# Patient Record
Sex: Female | Born: 1950
Health system: Southern US, Community
[De-identification: ages and names within clinical notes are randomized; demographics above are authoritative.]

## PROBLEM LIST (undated history)

## (undated) DIAGNOSIS — M6530 Trigger finger, unspecified finger: Secondary | ICD-10-CM

## (undated) DIAGNOSIS — I10 Essential (primary) hypertension: Secondary | ICD-10-CM

## (undated) DIAGNOSIS — F32A Depression, unspecified: Secondary | ICD-10-CM

## (undated) DIAGNOSIS — M1990 Unspecified osteoarthritis, unspecified site: Secondary | ICD-10-CM

## (undated) DIAGNOSIS — I5189 Other ill-defined heart diseases: Secondary | ICD-10-CM

## (undated) DIAGNOSIS — E7800 Pure hypercholesterolemia, unspecified: Secondary | ICD-10-CM

## (undated) DIAGNOSIS — C50919 Malignant neoplasm of unspecified site of unspecified female breast: Secondary | ICD-10-CM

## (undated) DIAGNOSIS — K219 Gastro-esophageal reflux disease without esophagitis: Secondary | ICD-10-CM

## (undated) DIAGNOSIS — I671 Cerebral aneurysm, nonruptured: Secondary | ICD-10-CM

## (undated) DIAGNOSIS — Z9889 Other specified postprocedural states: Secondary | ICD-10-CM

## (undated) DIAGNOSIS — F329 Major depressive disorder, single episode, unspecified: Secondary | ICD-10-CM

## (undated) DIAGNOSIS — N1831 Chronic kidney disease, stage 3a: Secondary | ICD-10-CM

## (undated) DIAGNOSIS — Z923 Personal history of irradiation: Secondary | ICD-10-CM

## (undated) DIAGNOSIS — G3184 Mild cognitive impairment, so stated: Secondary | ICD-10-CM

## (undated) DIAGNOSIS — K635 Polyp of colon: Secondary | ICD-10-CM

## (undated) HISTORY — DX: Gastro-esophageal reflux disease without esophagitis: K21.9

## (undated) HISTORY — DX: Malignant neoplasm of unspecified site of unspecified female breast: C50.919

## (undated) HISTORY — DX: Polyp of colon: K63.5

## (undated) HISTORY — DX: Cerebral aneurysm, nonruptured: I67.1

## (undated) HISTORY — DX: Depression, unspecified: F32.A

## (undated) HISTORY — DX: Pure hypercholesterolemia, unspecified: E78.00

## (undated) HISTORY — DX: Major depressive disorder, single episode, unspecified: F32.9

## (undated) HISTORY — DX: Trigger finger, unspecified finger: M65.30

## (undated) HISTORY — DX: Essential (primary) hypertension: I10

---

## 1982-03-20 DIAGNOSIS — Z90710 Acquired absence of both cervix and uterus: Secondary | ICD-10-CM | POA: Insufficient documentation

## 1982-03-20 HISTORY — PX: ABDOMINAL HYSTERECTOMY: SHX81

## 1985-03-20 HISTORY — PX: NASAL SINUS SURGERY: SHX719

## 1997-03-20 HISTORY — PX: CHOLECYSTECTOMY: SHX55

## 2003-03-21 HISTORY — PX: OTHER SURGICAL HISTORY: SHX169

## 2008-03-20 HISTORY — PX: BREAST LUMPECTOMY: SHX2

## 2008-03-20 HISTORY — PX: BREAST SURGERY: SHX581

## 2010-03-29 ENCOUNTER — Emergency Department: Payer: Self-pay | Admitting: Emergency Medicine

## 2011-03-21 HISTORY — PX: CARPAL TUNNEL RELEASE: SHX101

## 2012-02-16 ENCOUNTER — Telehealth: Payer: Self-pay | Admitting: *Deleted

## 2012-02-16 NOTE — Telephone Encounter (Signed)
Confirmed 03/26/12 appt w/ pt.  Mailed before appt letter & packet to pt.  Took paperwork to [facility] to scan.

## 2012-02-23 ENCOUNTER — Other Ambulatory Visit: Payer: Self-pay | Admitting: *Deleted

## 2012-02-23 DIAGNOSIS — C50919 Malignant neoplasm of unspecified site of unspecified female breast: Secondary | ICD-10-CM | POA: Insufficient documentation

## 2012-02-23 DIAGNOSIS — C50911 Malignant neoplasm of unspecified site of right female breast: Secondary | ICD-10-CM | POA: Insufficient documentation

## 2012-03-20 DIAGNOSIS — Z9889 Other specified postprocedural states: Secondary | ICD-10-CM | POA: Insufficient documentation

## 2012-03-26 ENCOUNTER — Ambulatory Visit (HOSPITAL_BASED_OUTPATIENT_CLINIC_OR_DEPARTMENT_OTHER): Payer: 59 | Admitting: Oncology

## 2012-03-26 ENCOUNTER — Ambulatory Visit (HOSPITAL_BASED_OUTPATIENT_CLINIC_OR_DEPARTMENT_OTHER): Payer: 59

## 2012-03-26 ENCOUNTER — Other Ambulatory Visit (HOSPITAL_BASED_OUTPATIENT_CLINIC_OR_DEPARTMENT_OTHER): Payer: 59 | Admitting: Lab

## 2012-03-26 ENCOUNTER — Encounter: Payer: Self-pay | Admitting: Oncology

## 2012-03-26 VITALS — BP 151/91 | HR 70 | Temp 97.2°F | Resp 20 | Ht 61.0 in | Wt 135.0 lb

## 2012-03-26 DIAGNOSIS — L989 Disorder of the skin and subcutaneous tissue, unspecified: Secondary | ICD-10-CM

## 2012-03-26 DIAGNOSIS — C50919 Malignant neoplasm of unspecified site of unspecified female breast: Secondary | ICD-10-CM

## 2012-03-26 DIAGNOSIS — M899 Disorder of bone, unspecified: Secondary | ICD-10-CM

## 2012-03-26 DIAGNOSIS — M949 Disorder of cartilage, unspecified: Secondary | ICD-10-CM

## 2012-03-26 DIAGNOSIS — Z170 Estrogen receptor positive status [ER+]: Secondary | ICD-10-CM

## 2012-03-26 LAB — COMPREHENSIVE METABOLIC PANEL (CC13)
ALT: 20 U/L (ref 0–55)
BUN: 22 mg/dL (ref 7.0–26.0)
CO2: 26 mEq/L (ref 22–29)
Calcium: 10.3 mg/dL (ref 8.4–10.4)
Chloride: 103 mEq/L (ref 98–107)
Creatinine: 1 mg/dL (ref 0.6–1.1)
Glucose: 99 mg/dl (ref 70–99)
Total Bilirubin: 0.48 mg/dL (ref 0.20–1.20)

## 2012-03-26 LAB — CBC WITH DIFFERENTIAL/PLATELET
BASO%: 0.7 % (ref 0.0–2.0)
HCT: 41.9 % (ref 34.8–46.6)
LYMPH%: 20.9 % (ref 14.0–49.7)
MCHC: 33.1 g/dL (ref 31.5–36.0)
MCV: 98.2 fL (ref 79.5–101.0)
MONO#: 0.7 10*3/uL (ref 0.1–0.9)
MONO%: 7.7 % (ref 0.0–14.0)
NEUT%: 69.1 % (ref 38.4–76.8)
Platelets: 270 10*3/uL (ref 145–400)
RBC: 4.27 10*6/uL (ref 3.70–5.45)
WBC: 8.6 10*3/uL (ref 3.9–10.3)

## 2012-03-26 MED ORDER — VENLAFAXINE HCL ER 37.5 MG PO CP24: 37.5000 mg | ORAL_CAPSULE | Freq: Every day | ORAL | Status: DC

## 2012-03-26 MED ORDER — TAMOXIFEN CITRATE 20 MG PO TABS: 20.0000 mg | ORAL_TABLET | Freq: Every day | ORAL | Status: DC

## 2012-03-26 NOTE — Progress Notes (Signed)
ID: Carolyn Lowe   DOB: 19-Jun-1950  MR#: [id number]  NWG#:[id number]  PCP: Shary Decamp GYN: Olivia Mackie SU:  OTHER MD: C. Jaynie Bream [phone])   HISTORY OF PRESENT ILLNESS: The patient had routine mammography in [facility] 11/04/59 which showed a possible mass in the left breast. Additional views confirmed a spiculated mass with irregular borders which was taller than wide and ultrasound-guided biopsy was performed 11/17/2008. The pathology showed a 6 mm invasive ductal carcinoma, grade 1, estrogen and progesterone receptor positive, HER-2 negative. She had her definitive left lumpectomy 01/04/2009, showing no additional tumor in the breast and 0 of 2 sentinel lymph nodes involved. She received adjuvant radiation through December of 2010 at which time she started anastrozole. Her subsequent history is as detailed below.  INTERVAL HISTORY: Carolyn Lowe moved to this area to be closer to her daughter and grandchildren. 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  REVIEW OF SYSTEMS: She is tolerating the anastrozole without significant problems. She has minimal hot flashes at present. She does have significant vaginal dryness. She has some joint pain but is not sure if this is due to the anastrozole or to her statin. Otherwise she has a couple of skin spots she wants to do look at. A detailed review of systems was otherwise noncontributory  PAST MEDICAL HISTORY: No past medical history on file. Hypertension, history of a brain aneurysm which apparently resolved spontaneously, history of hyperplastic colonic polyps, GERD, right trigger finger, hypercholesterolemia, depression,  PAST SURGICAL HISTORY: No past surgical history on file. Status post TAH/BSO in her early 30s, history of right knee arthroscopic surgery, history of sinus surgery, history of right carpal tunnel release  FAMILY HISTORY No family history on file. The patient's parents are still living, both in  their early 61s. The patient had 2 brothers, no sister. There is no history of breast or ovarian cancer in the family  GYNECOLOGIC HISTORY: Menarche age 38, first live birth age 62, status post hysterectomy in 1984, on hormone replacement from that time to 2010.  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 Her child from her first marriage, [name], is a Engineer, civil (consulting) in our maternity hospital. The patient's second marriage resulted in no children. She has been married 13 years to [name], who works as a Radiation protection practitioner for the [employer]. The patient has one stepbrother daughter and one grandson, currently 102 years old. She is not a Management consultant.  ADVANCED DIRECTIVES: Not in place.  HEALTH MAINTENANCE: History  Substance Use Topics  . Smoking status: Not on file  . Smokeless tobacco: Not on file  . Alcohol Use: Not on file     Colonoscopy: February of 2013; repeat in 2018 recommended  PAP:  Bone density: Normal bone density August 2010, severe osteopenia with a T score of -2.2 in the spine with repeat bone density may 2013  Lipid panel:  Allergies not on file  Current Outpatient Prescriptions  Medication Sig Dispense Refill  . aspirin 81 MG tablet Take 81 mg by mouth daily.      . Glucosamine-Chondroitin-Ca-D3 (TRIPLE FLEX 50+ PO) Take 1 tablet by mouth daily.      . pantoprazole (PROTONIX) 40 MG tablet Take 40 mg by mouth daily.      . simvastatin (ZOCOR) 40 MG tablet Take 40 mg by mouth every evening.      . venlafaxine XR (EFFEXOR-XR) 37.5 MG 24 hr capsule Take 1 capsule (  37.5 mg total) by mouth daily.  90 capsule  12    OBJECTIVE: Middle-aged white woman who appears well Filed Vitals:   03/26/12 1618  BP: 151/91  Pulse: 70  Temp: 97.2 F (36.2 C)  Resp: 20     Body mass index is 25.51 kg/(m^2).    ECOG FS: 0  Sclerae unicteric Oropharynx clear No cervical or supraclavicular adenopathy Lungs no rales or  rhonchi Heart regular rate and rhythm Abd benign MSK no focal spinal tenderness, no peripheral edema Neuro: nonfocal Breasts: The right breast is unremarkable. The left breast is status post lumpectomy. There is no evidence of local recurrence. The cosmetic result is excellent. Left axilla is benign  Skin: She has some seborrheic keratoses of no consequence. In the middle of her back however there is a 2 mm raised firm acanthotic lesion which is not pigmented and has some crusting.    LAB RESULTS: Lab Results  Component Value Date   WBC 8.6 03/26/2012   NEUTROABS 5.9 03/26/2012   HGB 13.9 03/26/2012   HCT 41.9 03/26/2012   MCV 98.2 03/26/2012   PLT 270 03/26/2012      Chemistry      Component Value Date/Time   NA 148* 03/26/2012 1608   K 4.6 03/26/2012 1608   CL 103 03/26/2012 1608   CO2 26 03/26/2012 1608   BUN 22.0 03/26/2012 1608   CREATININE 1.0 03/26/2012 1608      Component Value Date/Time   CALCIUM 10.3 03/26/2012 1608   ALKPHOS 73 03/26/2012 1608   AST 18 03/26/2012 1608   ALT 20 03/26/2012 1608   BILITOT 0.48 03/26/2012 1608       No results found for this basename: LABCA2    No components found with this basename: LABCA125    No results found for this basename: INR:1;PROTIME:1 in the last 168 hours  Urinalysis No results found for this basename: colorurine, appearanceur, labspec, phurine, glucoseu, hgbur, bilirubinur, ketonesur, proteinur, urobilinogen, nitrite, leukocytesur    STUDIES: No results found. Outside records reviewed  ASSESSMENT: 62 y.o. [place] woman status post left lumpectomy and sentinel lymph node sampling October 2010 for a pT1b pN0, stage IA invasive ductal carcinoma, grade 1, estrogen and progesterone receptor positive, HER-2 not amplified  (1) completed adjuvant radiation December 2010  (2) on anastrozole December 2010 to January 2014  (3) started tamoxifen January 2014  (4) osteopenia, with a T score dropping from normal August 2010 to -2.2 (spine) May  2013  PLAN: We spent the better part of our hour-long visit reviewing the pathology and prognosis of Cathy's tumor. She understands her chance of dying from this tumor is less than 1%. With 5 years of anastrozole or the equivalent the risk of recurrence will be in the 7% range. These are very favorable numbers of coarse.  We discussed the fact that switching from anastrozole to tamoxifen at this point would have some benefits. First, tamoxifen helps build bone while and anastrozole lowers bone density. Secondly, she is having significant vaginal dryness issues, and while she is on tamoxifen she is welcome to use vaginal estrogens such as Vagifem or ESTRING. In terms of risk reduction of course splitting the 5 years of antiestrogen therapy between tamoxifen and an aromatase inhibitor is no worse than continuing for 5 years of an aromatase inhibitor.  She has that small lesion on her back which I think is likely benign but nevertheless needs dermatologic followup, and she will be contacting [facility] for evaluation.  She is also a couple of months behind on her mammography, and she tells me she will call the breast center and schedule that herself.  She will return to see me in May. If she is tolerating the tamoxifen well, we will see her again in October and then one final time October of 2015.   Demauri Advincula C    03/26/2012

## 2012-03-26 NOTE — Progress Notes (Signed)
Checked in new patient. She forgot new card will bring back. No financial issues.

## 2012-03-27 ENCOUNTER — Telehealth: Payer: Self-pay | Admitting: Oncology

## 2012-03-27 NOTE — Telephone Encounter (Signed)
S/W THE PT AND SHE IS AWARE OF HER MAY 2014 APPTS

## 2012-04-01 ENCOUNTER — Encounter: Payer: Self-pay | Admitting: *Deleted

## 2012-04-01 NOTE — Progress Notes (Signed)
Mailed after appt letter to pt. 

## 2012-08-05 ENCOUNTER — Telehealth: Payer: Self-pay | Admitting: Oncology

## 2012-08-05 ENCOUNTER — Encounter: Payer: Self-pay | Admitting: Physician Assistant

## 2012-08-05 ENCOUNTER — Other Ambulatory Visit (HOSPITAL_BASED_OUTPATIENT_CLINIC_OR_DEPARTMENT_OTHER): Payer: 59 | Admitting: Lab

## 2012-08-05 ENCOUNTER — Ambulatory Visit (HOSPITAL_BASED_OUTPATIENT_CLINIC_OR_DEPARTMENT_OTHER): Payer: 59 | Admitting: Physician Assistant

## 2012-08-05 VITALS — BP 143/75 | HR 80 | Temp 98.4°F | Resp 20 | Ht 61.0 in | Wt 132.2 lb

## 2012-08-05 DIAGNOSIS — C50919 Malignant neoplasm of unspecified site of unspecified female breast: Secondary | ICD-10-CM

## 2012-08-05 DIAGNOSIS — Z853 Personal history of malignant neoplasm of breast: Secondary | ICD-10-CM

## 2012-08-05 DIAGNOSIS — M899 Disorder of bone, unspecified: Secondary | ICD-10-CM

## 2012-08-05 DIAGNOSIS — Z170 Estrogen receptor positive status [ER+]: Secondary | ICD-10-CM

## 2012-08-05 DIAGNOSIS — C50912 Malignant neoplasm of unspecified site of left female breast: Secondary | ICD-10-CM

## 2012-08-05 LAB — CBC WITH DIFFERENTIAL/PLATELET
Basophils Absolute: 0 10*3/uL (ref 0.0–0.1)
EOS%: 0.5 % (ref 0.0–7.0)
HCT: 38.9 % (ref 34.8–46.6)
HGB: 12.7 g/dL (ref 11.6–15.9)
LYMPH%: 21 % (ref 14.0–49.7)
MCH: 31.8 pg (ref 25.1–34.0)
MCV: 97.3 fL (ref 79.5–101.0)
MONO%: 5.5 % (ref 0.0–14.0)
NEUT%: 72.7 % (ref 38.4–76.8)
RDW: 13.3 % (ref 11.2–14.5)

## 2012-08-05 NOTE — Progress Notes (Signed)
ID: Carolyn Lowe   DOB: 1950/10/30  MR#: [id number]  WUJ#:[id number]  PCP: Carolyn Lowe GYN: Carolyn Lowe SU:  OTHER MD: Carolyn Lowe [phone])   HISTORY OF PRESENT ILLNESS: The patient had routine mammography in [facility] 11/03/2008 which showed a possible mass in the left breast. Additional views confirmed a spiculated mass with irregular borders which was taller than wide and ultrasound-guided biopsy was performed 11/17/2008. The pathology showed a 6 mm invasive ductal carcinoma, grade 1, estrogen and progesterone receptor positive, HER-2 negative. She had her definitive left lumpectomy 01/04/2009, showing no additional tumor in the breast and 0 of 2 sentinel lymph nodes involved. She received adjuvant radiation through December of 2010 at which time she started anastrozole. Her subsequent history is as detailed below.  INTERVAL HISTORY: Carolyn Lowe returns today for routine followup of her left breast carcinoma. Following her appointment here in January, she discontinued anastrozole, and switched to tamoxifen. She's been on the tamoxifen now for approximately 4 months, and is tolerating it extremely well. In fact she tells me she is feeling much better than she did on the anastrozole. She still has some hot flashes but they're very mild. She previously had some joint pain, but this has completely resolved. She also denies any problems currently with vaginal dryness.  Otherwise, interval history is remarkable for Carolyn Lowe having just returned from a 2 week trip to [place] which she tells me was "wonderful". They flew to [place], then took a cruise that visited all of the different islands.   REVIEW OF SYSTEMS: Carolyn Lowe has had no recent illnesses and denies any fevers, chills, or night sweats. She's had no rashes and denies any abnormal bruising or bleeding. Her appetite is good. She's had no nausea or change in bowel or bladder habits. No new cough, shortness of breath, chest pain, or  palpitations. She denies any abnormal headaches. She's had no peripheral swelling.  A detailed review of systems is otherwise noncontributory.  She is tolerating the anastrozole without significant problems. She has minimal hot flashes at present. She does have significant vaginal dryness. She has some joint pain but is not sure if this is due to the anastrozole or to her statin. Otherwise she has a couple of skin spots she wants to do look at. A detailed review of systems was otherwise noncontributory  PAST MEDICAL HISTORY: Past Medical History  Diagnosis Date  . Breast cancer   . Hypertension   . GERD (gastroesophageal reflux disease)   . Brain aneurysm     Resolved spontaneously  . Colon polyp, hyperplastic   . Right trigger finger   . Hypercholesterolemia   . Depression     PAST SURGICAL HISTORY: Past Surgical History  Procedure Laterality Date  . Abdominal hysterectomy    . Right knee arthroscopy Right   . Nasal sinus surgery    . Carpal tunnel release Right      FAMILY HISTORY History reviewed. No pertinent family history. The patient's parents are still living, both in their early 79s. The patient had 2 brothers, no sister. There is no history of breast or ovarian cancer in the family  GYNECOLOGIC HISTORY: Menarche age 47, first live birth age 19, status post hysterectomy in 1984, on hormone replacement from that time to 2010.  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 Her child from her first marriage, Carolyn Lowe, is a Engineer, civil (consulting) in our maternity hospital. The patient's second marriage resulted in no children. She  has been married 13 years to Carolyn Lowe, who works as a Radiation protection practitioner for the [employer]. The patient has one granddaughter and one grandson. She is not a Management consultant.  ADVANCED DIRECTIVES: Not in place.  HEALTH MAINTENANCE: History  Substance Use Topics  . Smoking status: Never Smoker   .  Smokeless tobacco: Never Used  . Alcohol Use: No     Colonoscopy: February of 2013; repeat in 2018 recommended  PAP:  Bone density: Normal bone density August 2010, severe osteopenia with a T score of -2.2 in the spine with repeat bone density May 2013  Lipid panel:  No Known Allergies  Current Outpatient Prescriptions  Medication Sig Dispense Refill  . aspirin 81 MG tablet Take 81 mg by mouth daily.      . Glucosamine-Chondroitin-Ca-D3 (TRIPLE FLEX 50+ PO) Take 1 tablet by mouth daily.      Marland Kitchen lisinopril (PRINIVIL,ZESTRIL) 10 MG tablet       . pantoprazole (PROTONIX) 40 MG tablet Take 40 mg by mouth daily.      . simvastatin (ZOCOR) 40 MG tablet Take 40 mg by mouth every evening.      . tamoxifen (NOLVADEX) 20 MG tablet       . venlafaxine XR (EFFEXOR-XR) 37.5 MG 24 hr capsule Take 1 capsule (37.5 mg total) by mouth daily.  90 capsule  12   No current facility-administered medications for this visit.    OBJECTIVE: Middle-aged white woman who appears well Filed Vitals:   08/05/12 1334  BP: 143/75  Pulse: 80  Temp: 98.4 F (36.9 C)  Resp: 20     Body mass index is 24.99 kg/(m^2).    ECOG FS: 0 Filed Weights   08/05/12 1334  Weight: 132 lb 3.2 oz (59.966 kg)   Sclerae unicteric Oropharynx clear No cervical or supraclavicular adenopathy Lungs clear to auscultation bilaterally, no rales or rhonchi Heart regular rate and rhythm Abd soft, nontender to palpation, positive bowel sounds MSK no focal spinal tenderness to palpation, no peripheral edema Neuro: nonfocal, well oriented friendly affect Breasts: The right breast is unremarkable. The left breast is status post lumpectomy. There is no suspicious nodularity, and no evidence of local recurrence. Axillae are benign bilaterally with no palpable adenopathy.   LAB RESULTS: Lab Results  Component Value Date   WBC 8.6 08/05/2012   NEUTROABS 6.3 08/05/2012   HGB 12.7 08/05/2012   HCT 38.9 08/05/2012   MCV 97.3 08/05/2012   PLT  232 08/05/2012      Chemistry      Component Value Date/Time   NA 148* 03/26/2012 1608   K 4.6 03/26/2012 1608   CL 103 03/26/2012 1608   CO2 26 03/26/2012 1608   BUN 22.0 03/26/2012 1608   CREATININE 1.0 03/26/2012 1608      Component Value Date/Time   CALCIUM 10.3 03/26/2012 1608   ALKPHOS 73 03/26/2012 1608   AST 18 03/26/2012 1608   ALT 20 03/26/2012 1608   BILITOT 0.48 03/26/2012 1608       STUDIES: No results found.   ASSESSMENT: 62 y.o. [place] woman originally from [place]   (1)  status post left lumpectomy and sentinel lymph node sampling October 2010 for a pT1b pN0, stage IA invasive ductal carcinoma, grade 1, estrogen and progesterone receptor positive, HER-2 not amplified  (2) completed adjuvant radiation December 2010  (3) on anastrozole December 2010 to January 2014  (4) started tamoxifen January 2014  (5) osteopenia, with a  T score dropping from normal August 2010 to -2.2 (spine) May 2013  PLAN:  Carolyn Lowe is tolerating the tamoxifen very well, and with regards to her breast cancer there is no clinical evidence of disease recurrence. She'll continue on tamoxifen daily. She is due for her mammogram which we are having scheduled for her later this week. She return to see [place] in 6 months, but will call prior that time with any changes or problems.    Tashonna Descoteaux    08/05/2012

## 2012-08-27 ENCOUNTER — Ambulatory Visit
Admission: RE | Admit: 2012-08-27 | Discharge: 2012-08-27 | Disposition: A | Discharge status: Home / Self Care | Payer: 59 | Source: Ambulatory Visit | Attending: Physician Assistant | Admitting: Physician Assistant

## 2012-08-27 DIAGNOSIS — Z853 Personal history of malignant neoplasm of breast: Secondary | ICD-10-CM

## 2013-01-27 ENCOUNTER — Other Ambulatory Visit (HOSPITAL_BASED_OUTPATIENT_CLINIC_OR_DEPARTMENT_OTHER): Payer: 59

## 2013-01-27 DIAGNOSIS — C50919 Malignant neoplasm of unspecified site of unspecified female breast: Secondary | ICD-10-CM

## 2013-01-27 DIAGNOSIS — C50912 Malignant neoplasm of unspecified site of left female breast: Secondary | ICD-10-CM

## 2013-01-27 LAB — COMPREHENSIVE METABOLIC PANEL (CC13)
Albumin: 3.8 g/dL (ref 3.5–5.0)
Alkaline Phosphatase: 46 U/L (ref 40–150)
Anion Gap: 9 mEq/L (ref 3–11)
BUN: 17.7 mg/dL (ref 7.0–26.0)
Glucose: 84 mg/dl (ref 70–140)
Potassium: 4.4 mEq/L (ref 3.5–5.1)

## 2013-01-27 LAB — CBC WITH DIFFERENTIAL/PLATELET
Basophils Absolute: 0.1 10*3/uL (ref 0.0–0.1)
Eosinophils Absolute: 0.2 10*3/uL (ref 0.0–0.5)
HGB: 13 g/dL (ref 11.6–15.9)
LYMPH%: 26.5 % (ref 14.0–49.7)
MCV: 97.7 fL (ref 79.5–101.0)
MONO%: 8.6 % (ref 0.0–14.0)
NEUT#: 4 10*3/uL (ref 1.5–6.5)
Platelets: 254 10*3/uL (ref 145–400)
RDW: 12.7 % (ref 11.2–14.5)

## 2013-02-03 ENCOUNTER — Encounter: Payer: Self-pay | Admitting: Physician Assistant

## 2013-02-03 ENCOUNTER — Ambulatory Visit (HOSPITAL_BASED_OUTPATIENT_CLINIC_OR_DEPARTMENT_OTHER): Payer: 59 | Admitting: Physician Assistant

## 2013-02-03 ENCOUNTER — Telehealth: Payer: Self-pay | Admitting: Oncology

## 2013-02-03 VITALS — BP 127/74 | HR 86 | Temp 98.2°F | Resp 18 | Ht 61.0 in | Wt 131.9 lb

## 2013-02-03 DIAGNOSIS — Z853 Personal history of malignant neoplasm of breast: Secondary | ICD-10-CM

## 2013-02-03 DIAGNOSIS — M8580 Other specified disorders of bone density and structure, unspecified site: Secondary | ICD-10-CM

## 2013-02-03 DIAGNOSIS — G4700 Insomnia, unspecified: Secondary | ICD-10-CM | POA: Insufficient documentation

## 2013-02-03 DIAGNOSIS — C50919 Malignant neoplasm of unspecified site of unspecified female breast: Secondary | ICD-10-CM

## 2013-02-03 DIAGNOSIS — Z780 Asymptomatic menopausal state: Secondary | ICD-10-CM

## 2013-02-03 DIAGNOSIS — M899 Disorder of bone, unspecified: Secondary | ICD-10-CM

## 2013-02-03 MED ORDER — TRAZODONE HCL 50 MG PO TABS: 50.0000 mg | ORAL_TABLET | Freq: Every day | ORAL | Status: DC

## 2013-02-03 NOTE — Telephone Encounter (Signed)
, °

## 2013-02-03 NOTE — Progress Notes (Signed)
ID: Carolyn Lowe   DOB: [birth year]  MR#: [id number]  WUJ#:[id number]  PCP: Thayer Headings, MD GYN: Olivia Mackie, MD SU:  OTHER MD: C. Jaynie Bream [phone])  CHIEF COMPLAINT:  Left Breast Cancer   HISTORY OF PRESENT ILLNESS: The patient had routine mammography in [facility] 11/03/2008 which showed a possible mass in the left breast. Additional views confirmed a spiculated mass with irregular borders which was taller than wide and ultrasound-guided biopsy was performed 11/17/2008. The pathology showed a 6 mm invasive ductal carcinoma, grade 1, estrogen and progesterone receptor positive, HER-2 negative. She had her definitive left lumpectomy 01/04/2009, showing no additional tumor in the breast and 0 of 2 sentinel lymph nodes involved. She received adjuvant radiation through December of 2010 at which time she started anastrozole. Her subsequent history is as detailed below.  INTERVAL HISTORY: Carolyn Lowe returns today for routine followup of her left breast carcinoma. She continues on tamoxifen with good tolerance. She finds that she sweats a little easier, but denies any actual hot flashes. She's had no signs of abnormal bleeding or blood clots. Specifically, she's had no vaginal bleeding and also denies any vaginal discharge or dryness.   In fact Carolyn Lowe's biggest complaint today is continued insomnia.  This is a chronic problem, and she has tried multiple treatments in the past. She took him in several years ago which was helpful. Benadryl does not make her sleepy. Melatonin does not make her sleepy. She has tried a therapy. She has good sleep habits, and tries to go to bed at the same time every night. She finds that she "tosses and turns" and wakes up several times throughout the night. This does not seem to be associated, however, with hot flashes.   Otherwise, interval history is remarkable for the fact that Carolyn Lowe's brother who is autistic and is currently residing with their  elderly mother in [place] is going to be relocating to [place], and will be living with [place]. Carolyn Lowe the process of "adding on" to the house.    REVIEW OF SYSTEMS: Carolyn Lowe has had no recent illnesses and denies any fevers, chills, or night sweats. She's had no rashes and denies any abnormal bruising or bleeding. Her appetite is good and she denies any nausea or change in bowel or bladder habits.She has had no new cough, shortness of breath, chest pain, or palpitations. She denies any abnormal headaches, dizziness, or change in vision. She's had no new or unusual myalgias, arthralgias, or bony pain. She's had no peripheral swelling.  A detailed review of systems is otherwise noncontributory.   PAST MEDICAL HISTORY: Past Medical History  Diagnosis Date  . Breast cancer   . Hypertension   . GERD (gastroesophageal reflux disease)   . Brain aneurysm     Resolved spontaneously  . Colon polyp, hyperplastic   . Right trigger finger   . Hypercholesterolemia   . Depression     PAST SURGICAL HISTORY: Past Surgical History  Procedure Laterality Date  . Abdominal hysterectomy    . Right knee arthroscopy Right   . Nasal sinus surgery    . Carpal tunnel release Right      FAMILY HISTORY No family history on file. The patient's parents are still living, both in their early 29s. The patient had 2 brothers, no sister. There is no history of breast or ovarian cancer in the family  GYNECOLOGIC HISTORY: Menarche age 74, first live birth age 66, status post hysterectomy in 1984, on hormone replacement from that time  to 2010.  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 Her child from her first marriage, Carolyn Lowe, is a Engineer, civil (consulting) in our maternity hospital. The patient's second marriage resulted in no children. She has been married 13 years to Carolyn Lowe, who works as a Radiation protection practitioner for the [employer].The patient has one  granddaughter and one grandson. She is not a Counsellor.  ADVANCED DIRECTIVES: Not in place.  HEALTH MAINTENANCE: (Updated November 2014) History  Substance Use Topics  . Smoking status: Never Smoker   . Smokeless tobacco: Never Used  . Alcohol Use: No     Colonoscopy: February of 2013; repeat in 2018    recommended  PAP:  s/p hysterectomy/Pelvic exam UTD, Dr. [name]  Bone density: Normal bone density August 2010, severe    osteopenia with a T score of -2.2 in the    spine with repeat bone density May 2013  Lipid panel:  Dr. [name]   No Known Allergies  Current Outpatient Prescriptions  Medication Sig Dispense Refill  . aspirin 81 MG tablet Take 81 mg by mouth daily.      Marland Kitchen FIBER SELECT GUMMIES PO Take by mouth daily.      . Glucosamine-Chondroitin-Ca-D3 (TRIPLE FLEX 50+ PO) Take 1 tablet by mouth daily.      Marland Kitchen lisinopril (PRINIVIL,ZESTRIL) 10 MG tablet       . Multiple Vitamins-Minerals (WOMENS MULTI VITAMIN & MINERAL PO) Take by mouth daily after supper.      . pantoprazole (PROTONIX) 40 MG tablet Take 40 mg by mouth daily.      . simvastatin (ZOCOR) 40 MG tablet Take 40 mg by mouth every evening.      . tamoxifen (NOLVADEX) 20 MG tablet       . venlafaxine XR (EFFEXOR-XR) 37.5 MG 24 hr capsule Take 1 capsule (37.5 mg total) by mouth daily.  90 capsule  12  . traZODone (DESYREL) 50 MG tablet Take 1 tablet (50 mg total) by mouth at bedtime.  30 tablet  1   No current facility-administered medications for this visit.    OBJECTIVE: Middle-aged white woman who appears well and is in no acute distress Filed Vitals:   02/03/13 1428  BP: 127/74  Pulse: 86  Temp: 98.2 F (36.8 C)  Resp: 18     Body mass index is 24.93 kg/(m^2).    ECOG FS: 0 Filed Weights   02/03/13 1428  Weight: 131 lb 14.4 oz (59.829 kg)   Physical Exam: HEENT:  Sclerae anicteric.  Oropharynx clear. Buccal mucosa is pink and moist. NODES:  No cervical or supraclavicular lymphadenopathy palpated.   BREAST EXAM:  Right breast is unremarkable. Left breast is status post lumpectomy with no evidence of local recurrence. Axillae are benign bilaterally, no palpable lymphadenopathy. LUNGS:  Clear to auscultation bilaterally.  No wheezes or rhonchi HEART:  Regular rate and rhythm. No murmur appreciated. ABDOMEN:  Soft, nontender.  Positive bowel sounds.  MSK:  No focal spinal tenderness to palpation. Full range of motion in the upper extremities. No joint swelling. EXTREMITIES:  No peripheral edema.   NEURO:  Nonfocal. Well oriented.  Positive affect.    LAB RESULTS: Lab Results  Component Value Date   WBC 6.7 01/27/2013   NEUTROABS 4.0 01/27/2013   HGB 13.0 01/27/2013   HCT 39.1 01/27/2013   MCV 97.7 01/27/2013   PLT 254 01/27/2013      Chemistry  Component Value Date/Time   NA 141 01/27/2013 1033   K 4.4 01/27/2013 1033   CL 103 03/26/2012 1608   CO2 24 01/27/2013 1033   BUN 17.7 01/27/2013 1033   CREATININE 0.9 01/27/2013 1033      Component Value Date/Time   CALCIUM 9.5 01/27/2013 1033   ALKPHOS 46 01/27/2013 1033   AST 22 01/27/2013 1033   ALT 24 01/27/2013 1033   BILITOT 0.47 01/27/2013 1033       STUDIES:  A bilateral mammogram on 08/27/2012 was unremarkable.  Most recent bone density in May 2013 showed osteopenia with a T score of -2.2.   ASSESSMENT: 62 y.o. [place] woman originally from [place]   (1)  status post left lumpectomy and sentinel lymph node sampling October 2010 for a pT1b pN0, stage IA invasive ductal carcinoma, grade 1, estrogen and progesterone receptor positive, HER-2 not amplified  (2) completed adjuvant radiation December 2010  (3) on anastrozole December 2010 to January 2014  (4) started tamoxifen January 2014  (5) osteopenia, with a T score dropping from normal August 2010 to -2.2 (spine) May 2013  PLAN:  Carolyn Lowe continues to tolerate the tamoxifen well, and will continue on her current regimen. With regards to her  breast cancer, she seems to be doing well, with no clinical evidence of disease recurrence. She'll be due for her next bilateral mammogram as well as a repeat bone density next June, and those will be scheduled accordingly at the breast Center.   We spent some time reviewing her sleep habits, and discussing options for treatment of insomnia. We're going to try a low dose of trazodone, 50 mg at night. I have given her the prescription today, 30 days with one refill. If this is helpful for her over the next few weeks, I have asked her to contact her primary care physician, Dr. [name], for followup, and in the future he will follow her for the insomnia.   Otherwise, we will see Carolyn Lowe again in late June after her mammogram and bone density. If she is still doing well at that point, we will likely initiate annual visits. She voices understanding and agreement with this plan and will call with any changes or problems prior to her next appointment.     Eddy Liszewski  PA-C    02/03/2013

## 2013-05-10 ENCOUNTER — Other Ambulatory Visit: Payer: Self-pay | Admitting: Oncology

## 2013-05-10 DIAGNOSIS — C50919 Malignant neoplasm of unspecified site of unspecified female breast: Secondary | ICD-10-CM

## 2013-05-12 ENCOUNTER — Telehealth: Payer: Self-pay | Admitting: *Deleted

## 2013-05-12 NOTE — Telephone Encounter (Signed)
Request form [facility] for Trazodone forwarded to Dr Thressa Sheller, PCP along with last office note, as discussed when prescribed in 01/2013 by Micah Flesher, PA

## 2013-05-27 ENCOUNTER — Emergency Department: Payer: Self-pay | Admitting: Emergency Medicine

## 2013-05-27 LAB — CBC WITH DIFFERENTIAL/PLATELET
BASOS PCT: 1.3 %
Basophil #: 0.1 10*3/uL (ref 0.0–0.1)
Eosinophil #: 0.2 10*3/uL (ref 0.0–0.7)
Eosinophil %: 2.6 %
HCT: 39 % (ref 35.0–47.0)
HGB: 13.1 g/dL (ref 12.0–16.0)
LYMPHS ABS: 1.8 10*3/uL (ref 1.0–3.6)
LYMPHS PCT: 25.1 %
MCH: 32.6 pg (ref 26.0–34.0)
MCHC: 33.5 g/dL (ref 32.0–36.0)
MCV: 97 fL (ref 80–100)
MONO ABS: 1 x10 3/mm — AB (ref 0.2–0.9)
Monocyte %: 13.3 %
Neutrophil #: 4.2 10*3/uL (ref 1.4–6.5)
Neutrophil %: 57.7 %
PLATELETS: 225 10*3/uL (ref 150–440)
RBC: 4.01 10*6/uL (ref 3.80–5.20)
RDW: 13.1 % (ref 11.5–14.5)
WBC: 7.3 10*3/uL (ref 3.6–11.0)

## 2013-05-27 LAB — BASIC METABOLIC PANEL
Anion Gap: 5 — ABNORMAL LOW (ref 7–16)
BUN: 16 mg/dL (ref 7–18)
CALCIUM: 8.9 mg/dL (ref 8.5–10.1)
Chloride: 109 mmol/L — ABNORMAL HIGH (ref 98–107)
Co2: 27 mmol/L (ref 21–32)
Creatinine: 1.03 mg/dL (ref 0.60–1.30)
EGFR (African American): >60
EGFR (Non-African Amer.): 59 — ABNORMAL LOW
Glucose: 108 mg/dL — ABNORMAL HIGH (ref 65–99)
OSMOLALITY: 283 (ref 275–301)
Potassium: 3.9 mmol/L (ref 3.5–5.1)
Sodium: 141 mmol/L (ref 136–145)

## 2013-05-27 LAB — ETHANOL: Ethanol: <3 mg/dL

## 2013-05-27 LAB — TROPONIN I: Troponin-I: <0.02 ng/mL

## 2013-06-17 ENCOUNTER — Telehealth: Payer: Self-pay | Admitting: Oncology

## 2013-06-17 NOTE — Telephone Encounter (Signed)
june appts cxd per 3/29 pof. lmonvm informing pt and gv new appts for sept/oct. schedule mailed.

## 2013-07-17 ENCOUNTER — Other Ambulatory Visit: Payer: Self-pay | Admitting: *Deleted

## 2013-07-17 DIAGNOSIS — C50919 Malignant neoplasm of unspecified site of unspecified female breast: Secondary | ICD-10-CM

## 2013-07-17 MED ORDER — VENLAFAXINE HCL ER 37.5 MG PO CP24
37.5000 mg | ORAL_CAPSULE | Freq: Every day | ORAL | Status: DC
Start: 2013-07-17 — End: 2013-10-14

## 2013-08-28 ENCOUNTER — Encounter (INDEPENDENT_AMBULATORY_CARE_PROVIDER_SITE_OTHER): Payer: Self-pay | Admitting: Surgery

## 2013-09-01 ENCOUNTER — Ambulatory Visit
Admission: RE | Admit: 2013-09-01 | Discharge: 2013-09-01 | Disposition: A | Discharge status: Home / Self Care | Payer: 59 | Source: Ambulatory Visit | Attending: Physician Assistant | Admitting: Physician Assistant

## 2013-09-01 ENCOUNTER — Other Ambulatory Visit: Payer: Self-pay

## 2013-09-01 DIAGNOSIS — Z853 Personal history of malignant neoplasm of breast: Secondary | ICD-10-CM

## 2013-09-01 DIAGNOSIS — Z780 Asymptomatic menopausal state: Secondary | ICD-10-CM

## 2013-09-01 DIAGNOSIS — M8580 Other specified disorders of bone density and structure, unspecified site: Secondary | ICD-10-CM

## 2013-09-08 ENCOUNTER — Encounter: Payer: Self-pay | Admitting: Oncology

## 2013-09-15 ENCOUNTER — Ambulatory Visit (INDEPENDENT_AMBULATORY_CARE_PROVIDER_SITE_OTHER): Payer: Self-pay | Admitting: Surgery

## 2013-09-24 ENCOUNTER — Ambulatory Visit (INDEPENDENT_AMBULATORY_CARE_PROVIDER_SITE_OTHER): Payer: 59 | Admitting: Surgery

## 2013-09-24 ENCOUNTER — Encounter (INDEPENDENT_AMBULATORY_CARE_PROVIDER_SITE_OTHER): Payer: Self-pay | Admitting: Surgery

## 2013-09-24 VITALS — BP 146/89 | HR 72 | Temp 98.6°F | Resp 18 | Ht 61.5 in | Wt 131.2 lb

## 2013-09-24 DIAGNOSIS — K432 Incisional hernia without obstruction or gangrene: Secondary | ICD-10-CM | POA: Insufficient documentation

## 2013-09-24 NOTE — Progress Notes (Signed)
Patient ID: Carolyn Lowe, female   DOB: Nov 11, 1950, 63 y.o.   MRN: [id number]  Chief Complaint  Patient presents with  . Hernia    HPI Carolyn Lowe is a 63 y.o. female.   HPI This is a very pleasant female referred to me by Dr. Thressa Sheller for evaluation of a symptomatic hernia in her upper abdomen. She reports he has had a hernia for many years. Is now getting larger and causing her moderate  Pain in her epigastrium and right upper quadrant. She hurts daily especially with sitting for long time. She has a bulge which he reports is always able to be reduced.  She has no obstructive symptoms. She has no nausea or vomiting. She is otherwise without complaints. Past Medical History  Diagnosis Date  . Breast cancer   . Hypertension   . GERD (gastroesophageal reflux disease)   . Brain aneurysm     Resolved spontaneously  . Colon polyp, hyperplastic   . Right trigger finger   . Hypercholesterolemia   . Depression     Past Surgical History  Procedure Laterality Date  . Abdominal hysterectomy    . Right knee arthroscopy Right   . Nasal sinus surgery    . Carpal tunnel release Right   cholecystectomy  History reviewed. No pertinent family history.  Social History History  Substance Use Topics  . Smoking status: Never Smoker   . Smokeless tobacco: Never Used  . Alcohol Use: No    No Known Allergies  Current Outpatient Prescriptions  Medication Sig Dispense Refill  . aspirin 81 MG tablet Take 81 mg by mouth daily.      . calcium & magnesium carbonates (MYLANTA) 311-232 MG per tablet Take 1 tablet by mouth daily.      . Cyanocobalamin (VITAMIN B 12) 250 MCG LOZG Take by mouth.      . FIBER SELECT GUMMIES PO Take by mouth daily.      . Ginkgo Biloba 40 MG TABS Take by mouth.      . Glucosamine-Chondroit-Vit C-Mn (GLUCOSAMINE 1500 COMPLEX PO) Take by mouth.      . Glucosamine-Chondroitin-Ca-D3 (TRIPLE FLEX 50+ PO) Take 1 tablet by mouth daily.      Marland Kitchen lisinopril  (PRINIVIL,ZESTRIL) 10 MG tablet       . Multiple Vitamins-Minerals (WOMENS MULTI VITAMIN & MINERAL PO) Take by mouth daily after supper.      . pantoprazole (PROTONIX) 40 MG tablet Take 40 mg by mouth daily.      . simvastatin (ZOCOR) 40 MG tablet Take 40 mg by mouth every evening.      . tamoxifen (NOLVADEX) 20 MG tablet Take 1 tablet by mouth  daily  90 tablet  1  . traZODone (DESYREL) 50 MG tablet Take 1 tablet (50 mg total) by mouth at bedtime.  30 tablet  1  . venlafaxine XR (EFFEXOR-XR) 37.5 MG 24 hr capsule Take 1 capsule (37.5 mg total) by mouth daily.  90 capsule  1  . vitamin E 1000 UNIT capsule Take 1,000 Units by mouth daily.       No current facility-administered medications for this visit.    Review of Systems Review of Systems  Constitutional: Negative for fever, chills and unexpected weight change.  HENT: Negative for congestion, hearing loss, sore throat, trouble swallowing and voice change.   Eyes: Negative for visual disturbance.  Respiratory: Negative for cough and wheezing.   Cardiovascular: Negative for chest pain, palpitations and leg swelling.  Gastrointestinal: Positive  for abdominal pain. Negative for nausea, vomiting, diarrhea, constipation, blood in stool, abdominal distention and anal bleeding.  Genitourinary: Negative for hematuria, vaginal bleeding and difficulty urinating.  Musculoskeletal: Negative for arthralgias.  Skin: Negative for rash and wound.  Neurological: Negative for seizures, syncope and headaches.  Hematological: Negative for adenopathy. Does not bruise/bleed easily.  Psychiatric/Behavioral: Negative for confusion.    Blood pressure 146/89, pulse 72, temperature 98.6 F (37 C), temperature source Temporal, resp. rate 18, height 5' 1.5" (1.562 m), weight 131 lb 3.2 oz (59.512 kg).  Physical Exam Physical Exam  Constitutional: She is oriented to person, place, and time. She appears well-developed and well-nourished. No distress.  HENT:   Head: Normocephalic and atraumatic.  Right Ear: External ear normal.  Left Ear: External ear normal.  Nose: Nose normal.  Mouth/Throat: Oropharynx is clear and moist.  Eyes: Conjunctivae are normal. Pupils are equal, round, and reactive to light. Right eye exhibits no discharge. Left eye exhibits no discharge. No scleral icterus.  Neck: Normal range of motion. Neck supple. No tracheal deviation present.  Cardiovascular: Normal rate, regular rhythm, normal heart sounds and intact distal pulses.   No murmur heard. Pulmonary/Chest: Effort normal and breath sounds normal. No respiratory distress. She has no wheezes.  Abdominal: Soft. Bowel sounds are normal. She exhibits no distension. There is no tenderness. There is no rebound.  There is an incisional hernia in her epigastrium underneath a previous scar from her cholecystectomy. The hernia moved to the right of the midline. It is nontender and reducible when she lies flat  Musculoskeletal: Normal range of motion. She exhibits no edema and no tenderness.  Lymphadenopathy:    She has no cervical adenopathy.  Neurological: She is alert and oriented to person, place, and time.  Skin: Skin is warm and dry. No rash noted. She is not diaphoretic. No erythema.  Psychiatric: Her behavior is normal. Judgment normal.    Data Reviewed   Assessment    Incisional hernia     Plan    I discussed the diagnosis with her in detail. I discussed repair with mesh. I discussed with the laparoscopic and open techniques. I am recommending laparoscopic incisional hernia repair with mesh. I discussed the risks which includes but is not limited to bleeding, infection, injury to the bowel, recurrent, the need to convert to an open procedure, etc. I also discussed postoperative recovery. She understands and wishes to proceed. Surgery will be scheduled        Carolyn Lowe A 09/24/2013, 2:19 PM

## 2013-10-14 ENCOUNTER — Encounter (HOSPITAL_COMMUNITY): Payer: Self-pay | Admitting: Pharmacy Technician

## 2013-10-16 ENCOUNTER — Encounter (HOSPITAL_COMMUNITY)
Admission: RE | Admit: 2013-10-16 | Discharge: 2013-10-16 | Disposition: A | Discharge status: Home / Self Care | Payer: 59 | Source: Ambulatory Visit | Attending: Surgery | Admitting: Surgery

## 2013-10-16 ENCOUNTER — Ambulatory Visit (HOSPITAL_COMMUNITY)
Admission: RE | Admit: 2013-10-16 | Discharge: 2013-10-16 | Disposition: A | Discharge status: Home / Self Care | Payer: 59 | Source: Ambulatory Visit | Attending: Anesthesiology | Admitting: Anesthesiology

## 2013-10-16 ENCOUNTER — Encounter (HOSPITAL_COMMUNITY): Payer: Self-pay

## 2013-10-16 DIAGNOSIS — Z01812 Encounter for preprocedural laboratory examination: Secondary | ICD-10-CM | POA: Diagnosis not present

## 2013-10-16 DIAGNOSIS — M47814 Spondylosis without myelopathy or radiculopathy, thoracic region: Secondary | ICD-10-CM | POA: Diagnosis not present

## 2013-10-16 DIAGNOSIS — I10 Essential (primary) hypertension: Secondary | ICD-10-CM | POA: Insufficient documentation

## 2013-10-16 DIAGNOSIS — Z01810 Encounter for preprocedural cardiovascular examination: Secondary | ICD-10-CM | POA: Insufficient documentation

## 2013-10-16 DIAGNOSIS — Z01818 Encounter for other preprocedural examination: Secondary | ICD-10-CM | POA: Insufficient documentation

## 2013-10-16 HISTORY — DX: Unspecified osteoarthritis, unspecified site: M19.90

## 2013-10-16 LAB — BASIC METABOLIC PANEL
Anion gap: 11 (ref 5–15)
BUN: 15 mg/dL (ref 6–23)
CALCIUM: 9.5 mg/dL (ref 8.4–10.5)
CO2: 25 mEq/L (ref 19–32)
Chloride: 106 mEq/L (ref 96–112)
Creatinine, Ser: 0.96 mg/dL (ref 0.50–1.10)
GFR calc non Af Amer: 63 mL/min — ABNORMAL LOW (ref >90)
GFR, EST AFRICAN AMERICAN: 72 mL/min — AB (ref >90)
GLUCOSE: 94 mg/dL (ref 70–99)
Potassium: 4.6 mEq/L (ref 3.7–5.3)
SODIUM: 142 meq/L (ref 137–147)

## 2013-10-16 LAB — CBC
HCT: 39.6 % (ref 36.0–46.0)
HEMOGLOBIN: 13.1 g/dL (ref 12.0–15.0)
MCH: 32.2 pg (ref 26.0–34.0)
MCHC: 33.1 g/dL (ref 30.0–36.0)
MCV: 97.3 fL (ref 78.0–100.0)
Platelets: 236 10*3/uL (ref 150–400)
RBC: 4.07 MIL/uL (ref 3.87–5.11)
RDW: 12.7 % (ref 11.5–15.5)
WBC: 7 10*3/uL (ref 4.0–10.5)

## 2013-10-16 NOTE — Pre-Procedure Instructions (Signed)
EKG AND CXR WERE DONE TODAY - PREOP AT [facility]. 

## 2013-10-16 NOTE — Patient Instructions (Addendum)
YOUR SURGERY IS SCHEDULED AT [facility]  ON:  Wednesday  8/5  REPORT TO  SHORT STAY CENTER AT:  6:30 AM   PLEASE COME IN THE [place] ENTRANCE AND FOLLOW SIGNS TO SHORT STAY CENTER.  DO NOT EAT OR DRINK ANYTHING AFTER MIDNIGHT THE NIGHT BEFORE YOUR SURGERY.  YOU MAY BRUSH YOUR TEETH, RINSE OUT YOUR MOUTH--BUT NO WATER, NO FOOD, NO CHEWING GUM, NO MINTS, NO CANDIES, NO CHEWING TOBACCO.  PLEASE TAKE THE FOLLOWING MEDICATIONS THE AM OF YOUR SURGERY WITH A FEW SIPS OF WATER:  NO MEDICATIONS TO TAKE   DO NOT BRING VALUABLES, MONEY, CREDIT CARDS.  DO NOT WEAR JEWELRY, MAKE-UP, NAIL POLISH AND NO METAL PINS OR CLIPS IN YOUR HAIR. CONTACT LENS, DENTURES / PARTIALS, GLASSES SHOULD NOT BE WORN TO SURGERY AND IN MOST CASES-HEARING AIDS WILL NEED TO BE REMOVED.  BRING YOUR GLASSES CASE, ANY EQUIPMENT NEEDED FOR YOUR CONTACT LENS. FOR PATIENTS ADMITTED TO THE HOSPITAL--CHECK OUT TIME THE DAY OF DISCHARGE IS 11:00 AM.  ALL INPATIENT ROOMS ARE PRIVATE - WITH BATHROOM, TELEPHONE, TELEVISION AND WIFI INTERNET.                                                    PLEASE READ OVER ANY  FACT SHEETS THAT YOU WERE GIVEN: MRSA INFORMATION, BLOOD TRANSFUSION INFORMATION, INCENTIVE SPIROMETER INFORMATION.  PLEASE BE AWARE THAT YOU MAY NEED ADDITIONAL BLOOD DRAWN DAY OF YOUR SURGERY  _______________________________________________________________________   [facility] - Preparing for Surgery Before surgery, you can play an important role.  Because skin is not sterile, your skin needs to be as free of germs as possible.  You can reduce the number of germs on your skin by washing with CHG (chlorahexidine gluconate) soap before surgery.  CHG is an antiseptic cleaner which kills germs and bonds with the skin to continue killing germs even after washing. Please DO NOT use if you have an allergy to CHG or antibacterial soaps.  If your skin becomes reddened/irritated stop using the CHG and  inform your nurse when you arrive at Short Stay. Do not shave (including legs and underarms) for at least 48 hours prior to the first CHG shower.  You may shave your face/neck. Please follow these instructions carefully:  1.  Shower with CHG Soap the night before surgery and the  morning of Surgery.  2.  If you choose to wash your hair, wash your hair first as usual with your  normal  shampoo.  3.  After you shampoo, rinse your hair and body thoroughly to remove the  shampoo.                           4.  Use CHG as you would any other liquid soap.  You can apply chg directly  to the skin and wash                       Gently with a scrungie or clean washcloth.  5.  Apply the CHG Soap to your body ONLY FROM THE NECK DOWN.   Do not use on face/ open  Wound or open sores. Avoid contact with eyes, ears mouth and genitals (private parts).                       Wash face,  Genitals (private parts) with your normal soap.             6.  Wash thoroughly, paying special attention to the area where your surgery  will be performed.  7.  Thoroughly rinse your body with warm water from the neck down.  8.  DO NOT shower/wash with your normal soap after using and rinsing off  the CHG Soap.                9.  Pat yourself dry with a clean towel.            10.  Wear clean pajamas.            11.  Place clean sheets on your bed the night of your first shower and do not  sleep with pets. Day of Surgery : Do not apply any lotions/deodorants the morning of surgery.  Please wear clean clothes to the hospital/surgery center.  FAILURE TO FOLLOW THESE INSTRUCTIONS MAY RESULT IN THE CANCELLATION OF YOUR SURGERY PATIENT SIGNATURE_________________________________  NURSE SIGNATURE__________________________________  ________________________________________________________________________

## 2013-10-21 NOTE — H&P (Signed)
Chief Complaint   Patient presents with   .  Hernia   HPI  Carolyn Lowe is a 63 y.o. female.  HPI  This is a very pleasant female referred to me by Dr. Thressa Sheller for evaluation of a symptomatic hernia in her upper abdomen. She reports he has had a hernia for many years. Is now getting larger and causing her moderate Pain in her epigastrium and right upper quadrant. She hurts daily especially with sitting for long time. She has a bulge which he reports is always able to be reduced. She has no obstructive symptoms. She has no nausea or vomiting. She is otherwise without complaints.  Past Medical History   Diagnosis  Date   .  Breast cancer    .  Hypertension    .  GERD (gastroesophageal reflux disease)    .  Brain aneurysm      Resolved spontaneously   .  Colon polyp, hyperplastic    .  Right trigger finger    .  Hypercholesterolemia    .  Depression     Past Surgical History   Procedure  Laterality  Date   .  Abdominal hysterectomy     .  Right knee arthroscopy  Right    .  Nasal sinus surgery     .  Carpal tunnel release  Right    cholecystectomy  History reviewed. No pertinent family history.  Social History  History   Substance Use Topics   .  Smoking status:  Never Smoker   .  Smokeless tobacco:  Never Used   .  Alcohol Use:  No   No Known Allergies  Current Outpatient Prescriptions   Medication  Sig  Dispense  Refill   .  aspirin 81 MG tablet  Take 81 mg by mouth daily.     .  calcium & magnesium carbonates (MYLANTA) 311-232 MG per tablet  Take 1 tablet by mouth daily.     .  Cyanocobalamin (VITAMIN B 12) 250 MCG LOZG  Take by mouth.     .  FIBER SELECT GUMMIES PO  Take by mouth daily.     .  Ginkgo Biloba 40 MG TABS  Take by mouth.     .  Glucosamine-Chondroit-Vit C-Mn (GLUCOSAMINE 1500 COMPLEX PO)  Take by mouth.     .  Glucosamine-Chondroitin-Ca-D3 (TRIPLE FLEX 50+ PO)  Take 1 tablet by mouth daily.     Marland Kitchen  lisinopril (PRINIVIL,ZESTRIL) 10 MG tablet       .  Multiple Vitamins-Minerals (WOMENS MULTI VITAMIN & MINERAL PO)  Take by mouth daily after supper.     .  pantoprazole (PROTONIX) 40 MG tablet  Take 40 mg by mouth daily.     .  simvastatin (ZOCOR) 40 MG tablet  Take 40 mg by mouth every evening.     .  tamoxifen (NOLVADEX) 20 MG tablet  Take 1 tablet by mouth daily  90 tablet  1   .  traZODone (DESYREL) 50 MG tablet  Take 1 tablet (50 mg total) by mouth at bedtime.  30 tablet  1   .  venlafaxine XR (EFFEXOR-XR) 37.5 MG 24 hr capsule  Take 1 capsule (37.5 mg total) by mouth daily.  90 capsule  1   .  vitamin E 1000 UNIT capsule  Take 1,000 Units by mouth daily.      No current facility-administered medications for this visit.   Review of Systems  Review of Systems  Constitutional: Negative for fever, chills and unexpected weight change.  HENT: Negative for congestion, hearing loss, sore throat, trouble swallowing and voice change.  Eyes: Negative for visual disturbance.  Respiratory: Negative for cough and wheezing.  Cardiovascular: Negative for chest pain, palpitations and leg swelling.  Gastrointestinal: Positive for abdominal pain. Negative for nausea, vomiting, diarrhea, constipation, blood in stool, abdominal distention and anal bleeding.  Genitourinary: Negative for hematuria, vaginal bleeding and difficulty urinating.  Musculoskeletal: Negative for arthralgias.  Skin: Negative for rash and wound.  Neurological: Negative for seizures, syncope and headaches.  Hematological: Negative for adenopathy. Does not bruise/bleed easily.  Psychiatric/Behavioral: Negative for confusion.  Blood pressure 146/89, pulse 72, temperature 98.6 F (37 C), temperature source Temporal, resp. rate 18, height 5' 1.5" (1.562 m), weight 131 lb 3.2 oz (59.512 kg).  Physical Exam  Physical Exam  Constitutional: She is oriented to person, place, and time. She appears well-developed and well-nourished. No distress.  HENT:  Head: Normocephalic and  atraumatic.  Right Ear: External ear normal.  Left Ear: External ear normal.  Nose: Nose normal.  Mouth/Throat: Oropharynx is clear and moist.  Eyes: Conjunctivae are normal. Pupils are equal, round, and reactive to light. Right eye exhibits no discharge. Left eye exhibits no discharge. No scleral icterus.  Neck: Normal range of motion. Neck supple. No tracheal deviation present.  Cardiovascular: Normal rate, regular rhythm, normal heart sounds and intact distal pulses.  No murmur heard.  Pulmonary/Chest: Effort normal and breath sounds normal. No respiratory distress. She has no wheezes.  Abdominal: Soft. Bowel sounds are normal. She exhibits no distension. There is no tenderness. There is no rebound.  There is an incisional hernia in her epigastrium underneath a previous scar from her cholecystectomy. The hernia moved to the right of the midline. It is nontender and reducible when she lies flat  Musculoskeletal: Normal range of motion. She exhibits no edema and no tenderness.  Lymphadenopathy:  She has no cervical adenopathy.  Neurological: She is alert and oriented to person, place, and time.  Skin: Skin is warm and dry. No rash noted. She is not diaphoretic. No erythema.  Psychiatric: Her behavior is normal. Judgment normal.  Data Reviewed  Assessment  Incisional hernia  Plan  I discussed the diagnosis with her in detail. I discussed repair with mesh. I discussed with the laparoscopic and open techniques. I am recommending laparoscopic incisional hernia repair with mesh. I discussed the risks which includes but is not limited to bleeding, infection, injury to the bowel, recurrent, the need to convert to an open procedure, etc. I also discussed postoperative recovery. She understands and wishes to proceed. Surgery will be scheduled

## 2013-10-22 ENCOUNTER — Observation Stay (HOSPITAL_COMMUNITY)
Admission: RE | Admit: 2013-10-22 | Discharge: 2013-10-23 | Disposition: A | Discharge status: Home / Self Care | Payer: 59 | Source: Ambulatory Visit | Attending: Surgery | Admitting: Surgery

## 2013-10-22 ENCOUNTER — Encounter (HOSPITAL_COMMUNITY)
Admission: RE | Disposition: A | Discharge status: Home / Self Care | Payer: Self-pay | Source: Ambulatory Visit | Attending: Surgery

## 2013-10-22 ENCOUNTER — Encounter (HOSPITAL_COMMUNITY): Payer: 59 | Admitting: Anesthesiology

## 2013-10-22 ENCOUNTER — Ambulatory Visit (HOSPITAL_COMMUNITY): Payer: 59 | Admitting: Anesthesiology

## 2013-10-22 ENCOUNTER — Encounter (HOSPITAL_COMMUNITY): Payer: Self-pay | Admitting: Anesthesiology

## 2013-10-22 DIAGNOSIS — E7800 Pure hypercholesterolemia, unspecified: Secondary | ICD-10-CM | POA: Diagnosis not present

## 2013-10-22 DIAGNOSIS — K432 Incisional hernia without obstruction or gangrene: Secondary | ICD-10-CM

## 2013-10-22 DIAGNOSIS — Z7982 Long term (current) use of aspirin: Secondary | ICD-10-CM | POA: Insufficient documentation

## 2013-10-22 DIAGNOSIS — F3289 Other specified depressive episodes: Secondary | ICD-10-CM | POA: Insufficient documentation

## 2013-10-22 DIAGNOSIS — K219 Gastro-esophageal reflux disease without esophagitis: Secondary | ICD-10-CM | POA: Insufficient documentation

## 2013-10-22 DIAGNOSIS — F329 Major depressive disorder, single episode, unspecified: Secondary | ICD-10-CM | POA: Insufficient documentation

## 2013-10-22 DIAGNOSIS — Z853 Personal history of malignant neoplasm of breast: Secondary | ICD-10-CM | POA: Diagnosis not present

## 2013-10-22 DIAGNOSIS — I739 Peripheral vascular disease, unspecified: Secondary | ICD-10-CM | POA: Insufficient documentation

## 2013-10-22 DIAGNOSIS — Z79899 Other long term (current) drug therapy: Secondary | ICD-10-CM | POA: Diagnosis not present

## 2013-10-22 DIAGNOSIS — I10 Essential (primary) hypertension: Secondary | ICD-10-CM | POA: Diagnosis not present

## 2013-10-22 DIAGNOSIS — Z90710 Acquired absence of both cervix and uterus: Secondary | ICD-10-CM | POA: Diagnosis not present

## 2013-10-22 HISTORY — PX: INCISIONAL HERNIA REPAIR: SHX193

## 2013-10-22 HISTORY — PX: INSERTION OF MESH: SHX5868

## 2013-10-22 SURGERY — REPAIR, HERNIA, INCISIONAL, LAPAROSCOPIC
Anesthesia: General | Site: Abdomen

## 2013-10-22 MED ORDER — LISINOPRIL 10 MG PO TABS
10.0000 mg | ORAL_TABLET | Freq: Every day | ORAL | Status: DC
  Administered 2013-10-22: 10 mg via ORAL
  Filled 2013-10-22 (×2): qty 1

## 2013-10-22 MED ORDER — ROCURONIUM BROMIDE 100 MG/10ML IV SOLN
INTRAVENOUS | Status: AC
  Filled 2013-10-22: qty 1

## 2013-10-22 MED ORDER — DEXAMETHASONE SODIUM PHOSPHATE 10 MG/ML IJ SOLN
INTRAMUSCULAR | Status: DC | PRN
  Administered 2013-10-22: 10 mg via INTRAVENOUS

## 2013-10-22 MED ORDER — HYDROMORPHONE HCL PF 1 MG/ML IJ SOLN
0.2500 mg | INTRAMUSCULAR | Status: DC | PRN
  Administered 2013-10-22 (×2): 0.5 mg via INTRAVENOUS

## 2013-10-22 MED ORDER — KETOROLAC TROMETHAMINE 30 MG/ML IJ SOLN
INTRAMUSCULAR | Status: AC
  Filled 2013-10-22: qty 1

## 2013-10-22 MED ORDER — ONDANSETRON HCL 4 MG/2ML IJ SOLN
INTRAMUSCULAR | Status: DC | PRN
  Administered 2013-10-22: 4 mg via INTRAVENOUS

## 2013-10-22 MED ORDER — 0.9 % SODIUM CHLORIDE (POUR BTL) OPTIME
TOPICAL | Status: DC | PRN
  Administered 2013-10-22: 1000 mL

## 2013-10-22 MED ORDER — NEOSTIGMINE METHYLSULFATE 10 MG/10ML IV SOLN
INTRAVENOUS | Status: AC
  Filled 2013-10-22: qty 1

## 2013-10-22 MED ORDER — LACTATED RINGERS IV SOLN
INTRAVENOUS | Status: DC | PRN
  Administered 2013-10-22: 08:00:00 via INTRAVENOUS

## 2013-10-22 MED ORDER — FENTANYL CITRATE 0.05 MG/ML IJ SOLN
INTRAMUSCULAR | Status: DC | PRN
  Administered 2013-10-22 (×2): 100 ug via INTRAVENOUS
  Administered 2013-10-22: 50 ug via INTRAVENOUS

## 2013-10-22 MED ORDER — HYDROMORPHONE HCL PF 1 MG/ML IJ SOLN: 1.0000 mg | INTRAMUSCULAR | Status: DC | PRN

## 2013-10-22 MED ORDER — MIDAZOLAM HCL 2 MG/2ML IJ SOLN
INTRAMUSCULAR | Status: AC
  Filled 2013-10-22: qty 2

## 2013-10-22 MED ORDER — PROPOFOL 10 MG/ML IV BOLUS
INTRAVENOUS | Status: DC | PRN
  Administered 2013-10-22: 160 mg via INTRAVENOUS

## 2013-10-22 MED ORDER — SCOPOLAMINE 1 MG/3DAYS TD PT72
MEDICATED_PATCH | TRANSDERMAL | Status: AC
  Filled 2013-10-22: qty 1

## 2013-10-22 MED ORDER — PROMETHAZINE HCL 25 MG/ML IJ SOLN: 6.2500 mg | INTRAMUSCULAR | Status: DC | PRN

## 2013-10-22 MED ORDER — MORPHINE SULFATE 2 MG/ML IJ SOLN: 1.0000 mg | INTRAMUSCULAR | Status: DC | PRN

## 2013-10-22 MED ORDER — FENTANYL CITRATE 0.05 MG/ML IJ SOLN
INTRAMUSCULAR | Status: AC
  Filled 2013-10-22: qty 5

## 2013-10-22 MED ORDER — HYDROMORPHONE HCL PF 1 MG/ML IJ SOLN
INTRAMUSCULAR | Status: AC
  Filled 2013-10-22: qty 1

## 2013-10-22 MED ORDER — CEFAZOLIN SODIUM-DEXTROSE 2-3 GM-% IV SOLR
INTRAVENOUS | Status: AC
  Filled 2013-10-22: qty 50

## 2013-10-22 MED ORDER — GLYCOPYRROLATE 0.2 MG/ML IJ SOLN
INTRAMUSCULAR | Status: DC | PRN
  Administered 2013-10-22: 0.6 mg via INTRAVENOUS

## 2013-10-22 MED ORDER — CEFAZOLIN SODIUM-DEXTROSE 2-3 GM-% IV SOLR
2.0000 g | INTRAVENOUS | Status: AC
  Administered 2013-10-22: 2 g via INTRAVENOUS

## 2013-10-22 MED ORDER — SCOPOLAMINE 1 MG/3DAYS TD PT72
MEDICATED_PATCH | TRANSDERMAL | Status: DC | PRN
  Administered 2013-10-22: 1 via TRANSDERMAL

## 2013-10-22 MED ORDER — DEXAMETHASONE SODIUM PHOSPHATE 10 MG/ML IJ SOLN
INTRAMUSCULAR | Status: AC
  Filled 2013-10-22: qty 1

## 2013-10-22 MED ORDER — SUCCINYLCHOLINE CHLORIDE 20 MG/ML IJ SOLN
INTRAMUSCULAR | Status: DC | PRN
  Administered 2013-10-22: 100 mg via INTRAVENOUS

## 2013-10-22 MED ORDER — LIDOCAINE HCL (CARDIAC) 20 MG/ML IV SOLN
INTRAVENOUS | Status: DC | PRN
  Administered 2013-10-22: 50 mg via INTRAVENOUS

## 2013-10-22 MED ORDER — TAMOXIFEN CITRATE 10 MG PO TABS
20.0000 mg | ORAL_TABLET | Freq: Every day | ORAL | Status: DC
  Filled 2013-10-22: qty 2

## 2013-10-22 MED ORDER — ENOXAPARIN SODIUM 40 MG/0.4ML ~~LOC~~ SOLN
40.0000 mg | SUBCUTANEOUS | Status: DC
Start: 2013-10-23 — End: 2013-10-23
  Administered 2013-10-23: 40 mg via SUBCUTANEOUS
  Filled 2013-10-22 (×2): qty 0.4

## 2013-10-22 MED ORDER — ROCURONIUM BROMIDE 100 MG/10ML IV SOLN
INTRAVENOUS | Status: DC | PRN
  Administered 2013-10-22: 5 mg via INTRAVENOUS
  Administered 2013-10-22: 15 mg via INTRAVENOUS

## 2013-10-22 MED ORDER — LIDOCAINE HCL (CARDIAC) 20 MG/ML IV SOLN
INTRAVENOUS | Status: AC
  Filled 2013-10-22: qty 5

## 2013-10-22 MED ORDER — GLYCOPYRROLATE 0.2 MG/ML IJ SOLN
INTRAMUSCULAR | Status: AC
  Filled 2013-10-22: qty 3

## 2013-10-22 MED ORDER — PROPOFOL 10 MG/ML IV BOLUS
INTRAVENOUS | Status: AC
  Filled 2013-10-22: qty 20

## 2013-10-22 MED ORDER — BUPIVACAINE-EPINEPHRINE 0.25% -1:200000 IJ SOLN
INTRAMUSCULAR | Status: AC
  Filled 2013-10-22: qty 1

## 2013-10-22 MED ORDER — ONDANSETRON HCL 4 MG PO TABS
4.0000 mg | ORAL_TABLET | Freq: Four times a day (QID) | ORAL | Status: DC | PRN
Start: 2013-10-22 — End: 2013-10-23

## 2013-10-22 MED ORDER — KETOROLAC TROMETHAMINE 30 MG/ML IJ SOLN
INTRAMUSCULAR | Status: DC | PRN
  Administered 2013-10-22: 30 mg via INTRAVENOUS

## 2013-10-22 MED ORDER — TAMOXIFEN CITRATE 10 MG PO TABS
20.0000 mg | ORAL_TABLET | Freq: Every day | ORAL | Status: DC
  Administered 2013-10-22: 20 mg via ORAL
  Filled 2013-10-22 (×2): qty 2

## 2013-10-22 MED ORDER — PANTOPRAZOLE SODIUM 40 MG PO TBEC
40.0000 mg | DELAYED_RELEASE_TABLET | Freq: Every day | ORAL | Status: DC
  Administered 2013-10-22: 40 mg via ORAL
  Filled 2013-10-22 (×2): qty 1

## 2013-10-22 MED ORDER — VENLAFAXINE HCL ER 37.5 MG PO CP24
37.5000 mg | ORAL_CAPSULE | Freq: Every day | ORAL | Status: DC
  Administered 2013-10-22: 37.5 mg via ORAL
  Filled 2013-10-22 (×2): qty 1

## 2013-10-22 MED ORDER — MIDAZOLAM HCL 5 MG/5ML IJ SOLN
INTRAMUSCULAR | Status: DC | PRN
  Administered 2013-10-22: 2 mg via INTRAVENOUS

## 2013-10-22 MED ORDER — ONDANSETRON HCL 4 MG/2ML IJ SOLN
4.0000 mg | Freq: Four times a day (QID) | INTRAMUSCULAR | Status: DC | PRN
  Administered 2013-10-22 (×2): 4 mg via INTRAVENOUS
  Filled 2013-10-22 (×2): qty 2

## 2013-10-22 MED ORDER — POTASSIUM CHLORIDE IN NACL 20-0.9 MEQ/L-% IV SOLN
INTRAVENOUS | Status: DC
  Administered 2013-10-22 – 2013-10-23 (×2): via INTRAVENOUS
  Filled 2013-10-22 (×2): qty 1000

## 2013-10-22 MED ORDER — LACTATED RINGERS IV SOLN
INTRAVENOUS | Status: DC
  Administered 2013-10-22: 10:00:00 via INTRAVENOUS

## 2013-10-22 MED ORDER — OXYCODONE-ACETAMINOPHEN 5-325 MG PO TABS: 1.0000 | ORAL_TABLET | ORAL | Status: DC | PRN

## 2013-10-22 MED ORDER — NEOSTIGMINE METHYLSULFATE 10 MG/10ML IV SOLN
INTRAVENOUS | Status: DC | PRN
  Administered 2013-10-22: 4 mg via INTRAVENOUS

## 2013-10-22 MED ORDER — ACETAMINOPHEN 325 MG PO TABS
650.0000 mg | ORAL_TABLET | ORAL | Status: DC | PRN
  Administered 2013-10-22: 650 mg via ORAL
  Filled 2013-10-22: qty 2

## 2013-10-22 MED ORDER — BUPIVACAINE-EPINEPHRINE 0.25% -1:200000 IJ SOLN
INTRAMUSCULAR | Status: DC | PRN
  Administered 2013-10-22: 20 mL

## 2013-10-22 MED ORDER — ONDANSETRON HCL 4 MG/2ML IJ SOLN
INTRAMUSCULAR | Status: AC
Start: 2013-10-22 — End: 2013-10-22
  Filled 2013-10-22: qty 2

## 2013-10-22 SURGICAL SUPPLY — 41 items
APPLIER CLIP 5 13 M/L LIGAMAX5 (MISCELLANEOUS)
BINDER ABDOMINAL 12 ML 46-62 (SOFTGOODS) IMPLANT
CABLE HIGH FREQUENCY MONO STRZ (ELECTRODE) ×3 IMPLANT
CANISTER SUCTION 2500CC (MISCELLANEOUS) ×3 IMPLANT
CHLORAPREP W/TINT 26ML (MISCELLANEOUS) ×3 IMPLANT
CLIP APPLIE 5 13 M/L LIGAMAX5 (MISCELLANEOUS) IMPLANT
CLOSURE WOUND 1/2 X4 (GAUZE/BANDAGES/DRESSINGS) ×2
DECANTER SPIKE VIAL GLASS SM (MISCELLANEOUS) ×3 IMPLANT
DERMABOND ADVANCED (GAUZE/BANDAGES/DRESSINGS) ×2
DERMABOND ADVANCED .7 DNX12 (GAUZE/BANDAGES/DRESSINGS) ×1 IMPLANT
DEVICE RELIATACK FIXATION (MISCELLANEOUS) ×3 IMPLANT
DEVICE SECURE STRAP 25 ABSORB (INSTRUMENTS) IMPLANT
DEVICE TROCAR PUNCTURE CLOSURE (ENDOMECHANICALS) ×3 IMPLANT
DRAPE LAPAROSCOPIC ABDOMINAL (DRAPES) ×3 IMPLANT
DRAPE UTILITY XL STRL (DRAPES) ×3 IMPLANT
ELECT REM PT RETURN 9FT ADLT (ELECTROSURGICAL) ×3
ELECTRODE REM PT RTRN 9FT ADLT (ELECTROSURGICAL) ×1 IMPLANT
GLOVE SURG SIGNA 7.5 PF LTX (GLOVE) ×3 IMPLANT
GOWN STRL REUS W/TWL XL LVL3 (GOWN DISPOSABLE) ×9 IMPLANT
KIT BASIN OR (CUSTOM PROCEDURE TRAY) ×3 IMPLANT
MARKER SKIN DUAL TIP RULER LAB (MISCELLANEOUS) ×3 IMPLANT
MESH VENTRALIGHT ST 4.5IN (Mesh General) ×3 IMPLANT
NEEDLE SPNL 22GX3.5 QUINCKE BK (NEEDLE) ×3 IMPLANT
RELOAD RELIATACK 5 (MISCELLANEOUS) ×3 IMPLANT
SCISSORS LAP 5X35 DISP (ENDOMECHANICALS) IMPLANT
SET IRRIG TUBING LAPAROSCOPIC (IRRIGATION / IRRIGATOR) IMPLANT
SHEARS HARMONIC ACE PLUS 36CM (ENDOMECHANICALS) IMPLANT
SLEEVE XCEL OPT CAN 5 100 (ENDOMECHANICALS) ×3 IMPLANT
SOLUTION ANTI FOG 6CC (MISCELLANEOUS) ×3 IMPLANT
STRIP CLOSURE SKIN 1/2X4 (GAUZE/BANDAGES/DRESSINGS) ×4 IMPLANT
SUT MNCRL AB 4-0 PS2 18 (SUTURE) ×3 IMPLANT
SUT NOVA NAB DX-16 0-1 5-0 T12 (SUTURE) ×3 IMPLANT
TACKER 5MM HERNIA 3.5CML NAB (ENDOMECHANICALS) IMPLANT
TOWEL OR 17X26 10 PK STRL BLUE (TOWEL DISPOSABLE) ×3 IMPLANT
TOWEL OR NON WOVEN STRL DISP B (DISPOSABLE) ×3 IMPLANT
TRAY FOLEY CATH 14FRSI W/METER (CATHETERS) ×3 IMPLANT
TRAY LAP CHOLE (CUSTOM PROCEDURE TRAY) ×3 IMPLANT
TROCAR XCEL BLUNT TIP 100MML (ENDOMECHANICALS) IMPLANT
TROCAR XCEL NON-BLD 11X100MML (ENDOMECHANICALS) ×3 IMPLANT
TROCAR XCEL NON-BLD 5MMX100MML (ENDOMECHANICALS) ×3 IMPLANT
TUBING INSUFFLATION 10FT LAP (TUBING) ×3 IMPLANT

## 2013-10-22 NOTE — Interval H&P Note (Signed)
History and Physical Interval Note: no change in H and P  10/22/2013 7:19 AM  Carolyn Lowe  has presented today for surgery, with the diagnosis of incisional hernia  The various methods of treatment have been discussed with the patient and family. After consideration of risks, benefits and other options for treatment, the patient has consented to  Procedure(s): LAPAROSCOPIC INCISIONAL HERNIA (N/A) INSERTION OF MESH (N/A) as a surgical intervention .  The patient's history has been reviewed, patient examined, no change in status, stable for surgery.  I have reviewed the patient's chart and labs.  Questions were answered to the patient's satisfaction.     Tanis Hensarling A

## 2013-10-22 NOTE — Transfer of Care (Signed)
Immediate Anesthesia Transfer of Care Note  Patient: Carolyn Lowe  Procedure(s) Performed: Procedure(s): LAPAROSCOPIC INCISIONAL HERNIA (N/A) INSERTION OF MESH (N/A)  Patient Location: PACU  Anesthesia Type:General  Level of Consciousness: awake, alert  and oriented  Airway & Oxygen Therapy: Patient Spontanous Breathing and Patient connected to face mask oxygen  Post-op Assessment: Report given to PACU RN and Post -op Vital signs reviewed and stable  Post vital signs: Reviewed and stable  Complications: No apparent anesthesia complications

## 2013-10-22 NOTE — Anesthesia Postprocedure Evaluation (Signed)
  Anesthesia Post-op Note  Patient: Carolyn Lowe  Procedure(s) Performed: Procedure(s) (LRB): LAPAROSCOPIC INCISIONAL HERNIA (N/A) INSERTION OF MESH (N/A)  Patient Location: PACU  Anesthesia Type: General  Level of Consciousness: awake and alert   Airway and Oxygen Therapy: Patient Spontanous Breathing  Post-op Pain: mild  Post-op Assessment: Post-op Vital signs reviewed, Patient's Cardiovascular Status Stable, Respiratory Function Stable, Patent Airway and No signs of Nausea or vomiting  Last Vitals:  Filed Vitals:   10/22/13 1015  BP: 137/86  Pulse: 75  Temp:   Resp: 14    Post-op Vital Signs: stable   Complications: No apparent anesthesia complications

## 2013-10-22 NOTE — Anesthesia Preprocedure Evaluation (Signed)
Anesthesia Evaluation  Patient identified by MRN, date of birth, ID band Patient awake    Reviewed: Allergy & Precautions, H&P , NPO status , Patient's Chart, lab work & pertinent test results  Airway Mallampati: II TM Distance: >3 FB Neck ROM: Full    Dental no notable dental hx.    Pulmonary neg pulmonary ROS,  breath sounds clear to auscultation  Pulmonary exam normal       Cardiovascular Exercise Tolerance: Good hypertension, Pt. on medications + Peripheral Vascular Disease Rhythm:Regular Rate:Normal     Neuro/Psych PSYCHIATRIC DISORDERS Depression negative neurological ROS     GI/Hepatic Neg liver ROS, GERD-  Medicated,  Endo/Other  negative endocrine ROS  Renal/GU negative Renal ROS  negative genitourinary   Musculoskeletal negative musculoskeletal ROS (+)   Abdominal   Peds negative pediatric ROS (+)  Hematology negative hematology ROS (+)   Anesthesia Other Findings   Reproductive/Obstetrics negative OB ROS                           Anesthesia Physical Anesthesia Plan  ASA: II  Anesthesia Plan: General   Post-op Pain Management:    Induction: Intravenous  Airway Management Planned: Oral ETT  Additional Equipment:   Intra-op Plan:   Post-operative Plan: Extubation in OR  Informed Consent: I have reviewed the patients History and Physical, chart, labs and discussed the procedure including the risks, benefits and alternatives for the proposed anesthesia with the patient or authorized representative who has indicated his/her understanding and acceptance.   Dental advisory given  Plan Discussed with: CRNA  Anesthesia Plan Comments:         Anesthesia Quick Evaluation

## 2013-10-22 NOTE — Op Note (Signed)
LAPAROSCOPIC INCISIONAL HERNIA, INSERTION OF MESH  Procedure Note  Carolyn Lowe 10/22/2013   Pre-op Diagnosis: incisional hernia     Post-op Diagnosis: same  Procedure(s): LAPAROSCOPIC INCISIONAL HERNIA INSERTION OF MESH  Surgeon(s): Harl Bowie, MD  Anesthesia: General  Staff:  Circulator: Ignacia Palma, RN Scrub Person: Rockwell Germany, CST; Misty La [place], [place]  Estimated Blood Loss: Minimal                         Carolyn Lowe   Date: 10/22/2013  Time: 9:20 AM

## 2013-10-22 NOTE — Op Note (Signed)
Carolyn Lowe, Carolyn Lowe NO.:  [id number]  MEDICAL RECORD NO.:  [id number]  LOCATION:  [facility]                         FACILITY:  [facility]  PHYSICIAN:  Coralie Keens, M.D. DATE OF BIRTH:  Jul 19, 1950  DATE OF PROCEDURE:  10/22/2013 DATE OF DISCHARGE:                              OPERATIVE REPORT   PREOPERATIVE DIAGNOSIS:  Incisional hernia.  POSTOPERATIVE DIAGNOSIS:  Incisional hernia.  PROCEDURE:  Laparoscopic incisional hernia repair with mesh (11.5 cm round Ventralight mesh).  SURGEON:  Coralie Keens, M.D.  ANESTHESIA:  General and 0.25% Marcaine.  ESTIMATED BLOOD LOSS:  Minimal.  INDICATIONS:  This is a 63 year old female has had an increasing hernia at the incision in the anterior epigastric area from previous surgery. She now presents for repair with mesh.  FINDINGS:  The patient was found to have a small fascial defect in the epigastrium which contained falciform ligament going up into the defect. It was repaired with an 11.5 cm piece of Ventralight mesh from [employer]. Greater than 4 cm overlap was achieved circumferentially.  PROCEDURE IN DETAIL:  The patient was brought into the operating room, identified as Carolyn Lowe.  She was placed supine on the operating room table and general anesthesia was induced.  Her abdomen was then prepped and draped in usual sterile fashion.  I made a small incision in the patient's left upper quadrant.  I used a 5-mm trocar and Optiview camera to slowly traverse all layers of the abdominal wall, and gain entrance to the peritoneal cavity under direct vision.  Insufflation was then begun.  I evaluated the introduction site and saw no evidence of bowel injury.  At this point, another 5-mm port was placed in the patient's left lower quadrant.  I could demonstrate the fascial defect, which was in the epigastrium and contained falciform ligament.  I changed a lower 5-mm trocar to an 11-mm trocar under direct vision.   I placed a third 5-mm trocar at the umbilicus.  I then took down the falciform ligament, reduced from the hernia sac with the Harmonic Scalpel.  We could visualize the fascial defect going up into the subcutaneous tissue.  At this point, an 11.5 cm round Ventralight Prolene patch from [employer] was brought into the field.  I placed 2 separate stay sutures in the mesh, I then rolled the mesh up and placed it through an 11 mm trocar, I then unrolled the mesh.  I made 2 separate skin incisions and used a suture passer to pull the sutures up through the abdominal wall.  These were tied and placed pulling the mesh against the fascia with wide overlap circumferentially around the fascial defect.  I then tacked in the mesh circumferentially with absorbable tacks under direct vision.  Again, wide coverage 4 cm of the fascial defect appeared to be achieved in all directions.  Hemostasis also appeared to be achieved.  All ports were removed under direct vision and the abdomen was deflated.  All incisions were then anesthetized with Marcaine and closed with 4-0 Monocryl subcuticular sutures.  Dermabond was then applied.  The patient tolerated the procedure well.  All the counts were correct at the end of procedure.  The patient was then  extubated in the operating room and taken in stable condition to recovery room.     Coralie Keens, M.D.     DB/MEDQ  D:  10/22/2013  T:  10/22/2013  Job:  [id number]

## 2013-10-23 ENCOUNTER — Encounter (HOSPITAL_COMMUNITY): Payer: Self-pay | Admitting: Surgery

## 2013-10-23 DIAGNOSIS — K432 Incisional hernia without obstruction or gangrene: Secondary | ICD-10-CM | POA: Diagnosis not present

## 2013-10-23 NOTE — Progress Notes (Signed)
Patient ID: Carolyn Lowe, female   DOB: [birth year], 63 y.o.   MRN: [id number]  Doing well No pain abd soft  discharge

## 2013-10-23 NOTE — Discharge Summary (Signed)
Physician Discharge Summary  Patient ID: Carolyn Lowe MRN: [id number] DOB/AGE: [birth year] 63 y.o.  Admit date: 10/22/2013 Discharge date: 10/23/2013  Admission Diagnoses:  Discharge Diagnoses:  Active Problems:   Incisional hernia   Discharged Condition: good  Hospital Course: uneventful post op recovery  Consults: None  Significant Diagnostic Studies: labs:   Treatments: surgery: laparoscopic incisional hernia repair with mesh  Discharge Exam: Blood pressure 129/73, pulse 72, temperature 98.2 F (36.8 C), temperature source Oral, resp. rate 16, height 5' 1.5" (1.562 m), weight 132 lb (59.875 kg), SpO2 97.00%. General appearance: alert Abdomen soft, minimally tender, incisions clean Cv: RRR Lungs clear  Disposition: Final discharge disposition not confirmed     Medication List         aspirin EC 81 MG tablet  Take 81 mg by mouth daily.     FIBER SELECT GUMMIES PO  Take 1 capsule by mouth daily.     Ginkgo Biloba 40 MG Tabs  Take 40 mg by mouth daily.     GLUCOSAMINE 1500 COMPLEX PO  Take 1 tablet by mouth daily.     lisinopril 10 MG tablet  Commonly known as:  PRINIVIL,ZESTRIL  Take 10 mg by mouth at bedtime.     multivitamin with minerals Tabs tablet  Take 1 tablet by mouth daily.     pantoprazole 40 MG tablet  Commonly known as:  PROTONIX  Take 40 mg by mouth at bedtime.     simvastatin 20 MG tablet  Commonly known as:  ZOCOR  Take 20 mg by mouth at bedtime.     tamoxifen 20 MG tablet  Commonly known as:  NOLVADEX  Take 1 tablet by mouth  daily     venlafaxine XR 37.5 MG 24 hr capsule  Commonly known as:  EFFEXOR-XR  Take 37.5 mg by mouth at bedtime.     vitamin E 1000 UNIT capsule  Take 1,000 Units by mouth daily.           Follow-up Information   Follow up with [facility] A, MD. Schedule an appointment as soon as possible for a visit in 3 weeks.   Specialty:  General Surgery   Contact information:   [address] [place] [address] [phone]       Signed: Harl Bowie 10/23/2013, 6:23 AM

## 2013-10-23 NOTE — Discharge Instructions (Signed)
[facility] ______[facility] °LAPAROSCOPIC SURGERY: POST OP INSTRUCTIONS °Always review your discharge instruction sheet given to you by the facility where your surgery was performed. °IF YOU HAVE DISABILITY OR FAMILY LEAVE FORMS, YOU MUST BRING THEM TO THE OFFICE FOR PROCESSING.   °DO NOT GIVE THEM TO YOUR DOCTOR. ° °1. A prescription for pain medication may be given to you upon discharge.  Take your pain medication as prescribed, if needed.  If narcotic pain medicine is not needed, then you may take acetaminophen (Tylenol) or ibuprofen (Advil) as needed. °2. Take your usually prescribed medications unless otherwise directed. °3. If you need a refill on your pain medication, please contact your pharmacy.  They will contact our office to request authorization. Prescriptions will not be filled after 5pm or on week-ends. °4. You should follow a light diet the first few days after arrival home, such as soup and crackers, etc.  Be sure to include lots of fluids daily. °5. Most patients will experience some swelling and bruising in the area of the incisions.  Ice packs will help.  Swelling and bruising can take several days to resolve.  °6. It is common to experience some constipation if taking pain medication after surgery.  Increasing fluid intake and taking a stool softener (such as Colace) will usually help or prevent this problem from occurring.  A mild laxative (Milk of Magnesia or Miralax) should be taken according to package instructions if there are no bowel movements after 48 hours. °7. Unless discharge instructions indicate otherwise, you may remove your bandages 24-48 hours after surgery, and you may shower at that time.  You may have steri-strips (small skin tapes) in place directly over the incision.  These strips should be left on the skin for 7-10 days.  If your surgeon used skin glue on the incision, you may shower in 24 hours.  The glue will flake off over the next 2-3 weeks.  Any sutures or  staples will be removed at the office during your follow-up visit. °8. ACTIVITIES:  You may resume regular (light) daily activities beginning the next day--such as daily self-care, walking, climbing stairs--gradually increasing activities as tolerated.  You may have sexual intercourse when it is comfortable.  Refrain from any heavy lifting or straining until approved by your doctor. °a. You may drive when you are no longer taking prescription pain medication, you can comfortably wear a seatbelt, and you can safely maneuver your car and apply brakes. °b. RETURN TO WORK:  __________________________________________________________ °9. You should see your doctor in the office for a follow-up appointment approximately 2-3 weeks after your surgery.  Make sure that you call for this appointment within a day or two after you arrive home to insure a convenient appointment time. °10. OTHER INSTRUCTIONS: __________________________________________________________________________________________________________________________ __________________________________________________________________________________________________________________________ °WHEN TO CALL YOUR DOCTOR: °1. Fever over 101.0 °2. Inability to urinate °3. Continued bleeding from incision. °4. Increased pain, redness, or drainage from the incision. °5. Increasing abdominal pain ° °The clinic staff is available to answer your questions during regular business hours.  Please don’t hesitate to call and ask to speak to one of the nurses for clinical concerns.  If you have a medical emergency, go to the nearest emergency room or call 911.  A surgeon from [facility] is always on call at the hospital. °[address], [address], [place], [place]  [address] ? [address] [address], [place], [place]   [address] °[phone] ? [phone] ? FAX [phone] °Web site:   [email or web address] °

## 2013-10-23 NOTE — Progress Notes (Signed)
Nurse reviewed discharge instructions with pt.  Pt verbalized understanding of discharge instructions and follow up appointments.  No concerns at time of discharge.   

## 2013-11-10 ENCOUNTER — Telehealth: Payer: Self-pay | Admitting: Oncology

## 2013-11-10 ENCOUNTER — Other Ambulatory Visit: Payer: Self-pay | Admitting: *Deleted

## 2013-11-10 DIAGNOSIS — C50919 Malignant neoplasm of unspecified site of unspecified female breast: Secondary | ICD-10-CM

## 2013-11-10 NOTE — Telephone Encounter (Signed)
pt cld to r/s appt-gave pt new time & date-pt understood

## 2013-11-11 ENCOUNTER — Other Ambulatory Visit: Payer: Self-pay

## 2013-11-18 ENCOUNTER — Ambulatory Visit: Payer: Self-pay | Admitting: Oncology

## 2013-12-01 ENCOUNTER — Other Ambulatory Visit: Payer: Self-pay | Admitting: Oncology

## 2014-01-07 ENCOUNTER — Other Ambulatory Visit (HOSPITAL_BASED_OUTPATIENT_CLINIC_OR_DEPARTMENT_OTHER): Payer: 59

## 2014-01-07 DIAGNOSIS — C50919 Malignant neoplasm of unspecified site of unspecified female breast: Secondary | ICD-10-CM

## 2014-01-07 LAB — CBC WITH DIFFERENTIAL/PLATELET
BASO%: 0.6 % (ref 0.0–2.0)
Basophils Absolute: 0 10*3/uL (ref 0.0–0.1)
EOS ABS: 0.2 10*3/uL (ref 0.0–0.5)
EOS%: 2.6 % (ref 0.0–7.0)
HCT: 39.4 % (ref 34.8–46.6)
HGB: 12.8 g/dL (ref 11.6–15.9)
LYMPH%: 24.3 % (ref 14.0–49.7)
MCH: 31.4 pg (ref 25.1–34.0)
MCHC: 32.5 g/dL (ref 31.5–36.0)
MCV: 96.8 fL (ref 79.5–101.0)
MONO#: 0.5 10*3/uL (ref 0.1–0.9)
MONO%: 8.6 % (ref 0.0–14.0)
NEUT%: 63.9 % (ref 38.4–76.8)
NEUTROS ABS: 4 10*3/uL (ref 1.5–6.5)
Platelets: 237 10*3/uL (ref 145–400)
RBC: 4.07 10*6/uL (ref 3.70–5.45)
RDW: 13.1 % (ref 11.2–14.5)
WBC: 6.3 10*3/uL (ref 3.9–10.3)
lymph#: 1.5 10*3/uL (ref 0.9–3.3)

## 2014-01-07 LAB — COMPREHENSIVE METABOLIC PANEL (CC13)
ALK PHOS: 43 U/L (ref 40–150)
ALT: 32 U/L (ref 0–55)
ANION GAP: 8 meq/L (ref 3–11)
AST: 25 U/L (ref 5–34)
Albumin: 3.8 g/dL (ref 3.5–5.0)
BILIRUBIN TOTAL: 0.2 mg/dL (ref 0.20–1.20)
BUN: 20 mg/dL (ref 7.0–26.0)
CO2: 20 meq/L — AB (ref 22–29)
CREATININE: 1 mg/dL (ref 0.6–1.1)
Calcium: 9.3 mg/dL (ref 8.4–10.4)
Chloride: 111 mEq/L — ABNORMAL HIGH (ref 98–109)
GLUCOSE: 103 mg/dL (ref 70–140)
Potassium: 4.2 mEq/L (ref 3.5–5.1)
SODIUM: 140 meq/L (ref 136–145)
TOTAL PROTEIN: 6.7 g/dL (ref 6.4–8.3)

## 2014-01-12 ENCOUNTER — Other Ambulatory Visit: Payer: Self-pay | Admitting: Oncology

## 2014-01-14 ENCOUNTER — Ambulatory Visit (HOSPITAL_BASED_OUTPATIENT_CLINIC_OR_DEPARTMENT_OTHER): Payer: 59 | Admitting: Oncology

## 2014-01-14 VITALS — BP 141/84 | HR 75 | Temp 98.5°F | Resp 18 | Ht 61.5 in | Wt 131.6 lb

## 2014-01-14 DIAGNOSIS — M949 Disorder of cartilage, unspecified: Secondary | ICD-10-CM

## 2014-01-14 DIAGNOSIS — C50912 Malignant neoplasm of unspecified site of left female breast: Secondary | ICD-10-CM

## 2014-01-14 DIAGNOSIS — Z853 Personal history of malignant neoplasm of breast: Secondary | ICD-10-CM | POA: Insufficient documentation

## 2014-01-14 NOTE — Progress Notes (Signed)
ID: Carolyn Lowe   DOB: 12/14/1950  MR#: [id number]  PIR#:[id number]  PCP: Thressa Sheller, MD GYN: Brien Few, MD SU:  OTHER MD: Carolyn Lowe [phone]  CHIEF COMPLAINT:  Left Breast Cancer  CURRENT TREATMENT: Tamoxifen   HISTORY OF PRESENT ILLNESS: From the original intake note:  The patient had routine mammography in [facility] 11/03/2008 which showed a possible mass in the left breast. Additional views confirmed a spiculated mass with irregular borders which was taller than wide and ultrasound-guided biopsy was performed 11/17/2008. The pathology showed a 6 mm invasive ductal carcinoma, grade 1, estrogen and progesterone receptor positive, HER-2 negative. She had her definitive left lumpectomy 01/04/2009, showing no additional tumor in the breast and 0 of 2 sentinel lymph nodes involved. She received adjuvant radiation through December of 2010 at which time she started anastrozole.   Her subsequent history is as detailed below.  INTERVAL HISTORY: Carolyn Lowe returns today for routine followup of her left breast carcinoma. The interval history is unremarkable. She continues on tamoxifen, with good tolerance and particularly hot flashes and vaginal wetness are not a problem.  REVIEW OF SYSTEMS: Carolyn Lowe walks about 7 or 8000 steps a day in her yard. She has some stress urinary incontinence and some arthritis problems. She describes herself is moderately fatigued but that's because she is so active. She is sleeping a little bit better than she was before. Otherwise a detailed review of systems today was noncontributory  PAST MEDICAL HISTORY: Past Medical History  Diagnosis Date  . Breast cancer   . Hypertension   . GERD (gastroesophageal reflux disease)   . Colon polyp, hyperplastic   . Right trigger finger   . Hypercholesterolemia   . Brain aneurysm     Resolved spontaneously- IT SEALED IT SELF - PT WAS IN HER EARLY 30'S   . Depression     DEPRESSION RELATED TO  HX BREAST CANCER/ ARIMEDEX SIDE EFFECTS- NO LONGER ON ARIMEDEX - AND DOING BETTER  . Arthritis     PAST SURGICAL HISTORY: Past Surgical History  Procedure Laterality Date  . Right knee arthroscopy Right 2005  . Nasal sinus surgery  1987  . Carpal tunnel release Right 2013  . Cholecystectomy  1999  . Abdominal hysterectomy  1984  . Breast surgery  2010    LEFT BREAST LUMPECTOMY AND AXILLARY NODE DISSECTION  . Incisional hernia repair N/A 10/22/2013    Procedure: LAPAROSCOPIC INCISIONAL HERNIA;  Surgeon: Harl Bowie, MD;  Location: [facility];  Service: General;  Laterality: N/A;  . Insertion of mesh N/A 10/22/2013    Procedure: INSERTION OF MESH;  Surgeon: Harl Bowie, MD;  Location: [facility];  Service: General;  Laterality: N/A;     FAMILY HISTORY No family history on file. The patient's parents are still living, both in their early 51s. The patient had 2 brothers, no sister. There is no history of breast or ovarian cancer in the family  GYNECOLOGIC HISTORY: Menarche age 56, first live birth age 58, status post hysterectomy in 1984, on hormone replacement from that time to 2010.  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 Her child from her first marriage, Carolyn Lowe, is a Marine scientist in our maternity hospital. The patient's second marriage resulted in no children. She has been married 13 years to Carolyn Lowe, who works as a Audiological scientist for the [employer].The patient has one granddaughter and one grandson. She is not a  church  attender. Her autistic brother is currently living with her.  ADVANCED DIRECTIVES: Not in place.  HEALTH MAINTENANCE: (Updated November 2014) History  Substance Use Topics  . Smoking status: Never Smoker   . Smokeless tobacco: Never Used  . Alcohol Use: No     Colonoscopy: February of 2013; repeat in 2018    recommended  PAP:  s/p hysterectomy/Pelvic exam UTD, Dr. Ronita Hipps  Bone  density: Normal bone density August 2010, severe    osteopenia with a T score of -2.2 in the    spine with repeat bone density May 2013  Lipid panel:  Dr. Alyson Ingles   No Known Allergies  Current Outpatient Prescriptions  Medication Sig Dispense Refill  . aspirin EC 81 MG tablet Take 81 mg by mouth daily.      Marland Kitchen FIBER SELECT GUMMIES PO Take 1 capsule by mouth daily.       . Ginkgo Biloba 40 MG TABS Take 40 mg by mouth daily.       . Glucosamine-Chondroit-Vit C-Mn (GLUCOSAMINE 1500 COMPLEX PO) Take 1 tablet by mouth daily.       Marland Kitchen lisinopril (PRINIVIL,ZESTRIL) 10 MG tablet Take 10 mg by mouth at bedtime.       . Multiple Vitamin (MULTIVITAMIN WITH MINERALS) TABS tablet Take 1 tablet by mouth daily.      . pantoprazole (PROTONIX) 40 MG tablet Take 40 mg by mouth at bedtime.       . simvastatin (ZOCOR) 20 MG tablet Take 20 mg by mouth at bedtime.       . tamoxifen (NOLVADEX) 20 MG tablet Take 1 tablet by mouth  daily  90 tablet  0  . venlafaxine XR (EFFEXOR-XR) 37.5 MG 24 hr capsule Take 1 capsule by mouth  daily  90 capsule  0  . vitamin E 1000 UNIT capsule Take 1,000 Units by mouth daily.       No current facility-administered medications for this visit.    OBJECTIVE: Middle-aged white woman who appears her stated age 63 Vitals:   01/14/14 1557  BP: 141/84  Lowe: 75  Temp: 98.5 F (36.9 C)  Resp: 18     Body mass index is 24.47 kg/(m^2).    ECOG FS: 0 Filed Weights   01/14/14 1557  Weight: 131 lb 9.6 oz (59.693 kg)  Sclerae unicteric, pupils equal and reactive Oropharynx clear and moist-- no thrush No cervical or supraclavicular adenopathy Lungs no rales or rhonchi Heart regular rate and rhythm Abd soft, nontender, positive bowel sounds MSK no focal spinal tenderness, no upper extremity lymphedema Neuro: nonfocal, well oriented, appropriate affect Breasts: The right breast is unremarkable. The left breast is status post lumpectomy and radiation. There is no evidence of  local recurrence. Left axilla is benign.   LAB RESULTS: Lab Results  Component Value Date   WBC 6.3 01/07/2014   NEUTROABS 4.0 01/07/2014   HGB 12.8 01/07/2014   HCT 39.4 01/07/2014   MCV 96.8 01/07/2014   PLT 237 01/07/2014      Chemistry      Component Value Date/Time   NA 140 01/07/2014 0859   NA 142 10/16/2013 1000   K 4.2 01/07/2014 0859   K 4.6 10/16/2013 1000   CL 106 10/16/2013 1000   CL 103 03/26/2012 1608   CO2 20* 01/07/2014 0859   CO2 25 10/16/2013 1000   BUN 20.0 01/07/2014 0859   BUN 15 10/16/2013 1000   CREATININE 1.0 01/07/2014 0859   CREATININE 0.96 10/16/2013  1000      Component Value Date/Time   CALCIUM 9.3 01/07/2014 0859   CALCIUM 9.5 10/16/2013 1000   ALKPHOS 43 01/07/2014 0859   AST 25 01/07/2014 0859   ALT 32 01/07/2014 0859   BILITOT 0.20 01/07/2014 0859       STUDIES:  CLINICAL DATA: Postmenopausal.  EXAM:  DUAL X-RAY ABSORPTIOMETRY (DXA) FOR BONE MINERAL DENSITY  FINDINGS:  AP LUMBAR SPINE L1, L2 and L4  Bone Mineral Density (BMD): 0.842 g/cm2  Young Adult T-Score: -1.7  Z-Score: -0.2  LEFT FEMUR TOTAL  Bone Mineral Density (BMD): 0.930 g/cm2  Young Adult T-Score: -0.1  Z-Score: 0.9  LEFT DISTAL RADIUS DISTAL 1/3  Bone Mineral Density (BMD): 0.648 g/cm2  Young Adult T-Score: -0.8  Z-Score: 0.6  ASSESSMENT: Patient's diagnostic category is LOW BONE MASS by WHO  Criteria.  FRAX: Based on the [place], the 10 year  probability of a major osteoporotic fracture is 6.4%. The 10 year  probability of a hip fracture is 0.1%.  COMPARISON: None.     ASSESSMENT: 63 y.o. [place] woman originally from [place]   (1)  status post left lumpectomy and sentinel lymph node sampling October 2010 for a pT1b pN0, stage IA invasive ductal carcinoma, grade 1, estrogen and progesterone receptor positive, HER-2 not amplified  (2) completed adjuvant radiation December 2010  (3) on anastrozole December 2010 to January  2014  (4) started tamoxifen January 2014, completed November 2015.   (5) osteopenia, with a T score dropping from normal August 2010 to -2.2 (spine) May 2013  PLAN:  Carolyn Lowe will have completed 5 years of antiestrogen therapy by the time she finishes her current prescription of tamoxifen in January. We don't have data that going beyond that is helpful promises or harmful for that matter). In her case, she would prefer to "graduate" at this point and I am very comfortable with that.  Accordingly we are not making any further routine appointments for [facility] here. She understands I will be glad to see her at any point in the future if the need arises.  We did discuss her recent bone density. This shows a T score of -1.7 which is better than the one 2 years ago. I encouraged her to continue her walking program.  Of course she will still need yearly mammography and a yearly physician breast exam. She will obtain that through her primary care physician and gynecologist.     Chauncey Cruel, MD     01/14/2014

## 2014-01-14 NOTE — Addendum Note (Signed)
Addended by: Laureen Abrahams on: 01/14/2014 05:55 PM   Modules accepted: Medications

## 2014-05-30 ENCOUNTER — Other Ambulatory Visit: Payer: Self-pay | Admitting: Oncology

## 2014-06-01 ENCOUNTER — Other Ambulatory Visit: Payer: Self-pay | Admitting: Emergency Medicine

## 2014-06-01 MED ORDER — VENLAFAXINE HCL ER 37.5 MG PO CP24: 37.5000 mg | ORAL_CAPSULE | Freq: Every day | ORAL | Status: DC

## 2014-11-06 ENCOUNTER — Other Ambulatory Visit: Payer: Self-pay | Admitting: Oncology

## 2014-11-06 DIAGNOSIS — Z9889 Other specified postprocedural states: Secondary | ICD-10-CM

## 2014-11-06 DIAGNOSIS — Z853 Personal history of malignant neoplasm of breast: Secondary | ICD-10-CM

## 2014-11-16 ENCOUNTER — Ambulatory Visit
Admission: RE | Admit: 2014-11-16 | Discharge: 2014-11-16 | Disposition: A | Discharge status: Home / Self Care | Payer: 59 | Source: Ambulatory Visit | Attending: Oncology | Admitting: Oncology

## 2014-11-16 DIAGNOSIS — Z853 Personal history of malignant neoplasm of breast: Secondary | ICD-10-CM

## 2014-11-16 DIAGNOSIS — Z9889 Other specified postprocedural states: Secondary | ICD-10-CM

## 2015-03-26 ENCOUNTER — Ambulatory Visit: Payer: 59 | Admitting: Podiatry

## 2015-05-15 ENCOUNTER — Other Ambulatory Visit: Payer: Self-pay | Admitting: Oncology

## 2015-05-17 ENCOUNTER — Other Ambulatory Visit: Payer: Self-pay | Admitting: *Deleted

## 2015-05-17 MED ORDER — VENLAFAXINE HCL ER 37.5 MG PO CP24
ORAL_CAPSULE | ORAL | Status: DC
Start: 2015-05-17 — End: 2015-08-21

## 2015-08-20 IMAGING — CR DG CHEST 2V
2 series · 2 of 2 positions shown · non-contrast
Comparison: May 27, 2013.

CLINICAL DATA: Hypertension.

EXAM:
CHEST  2 VIEW

[w chest pa]
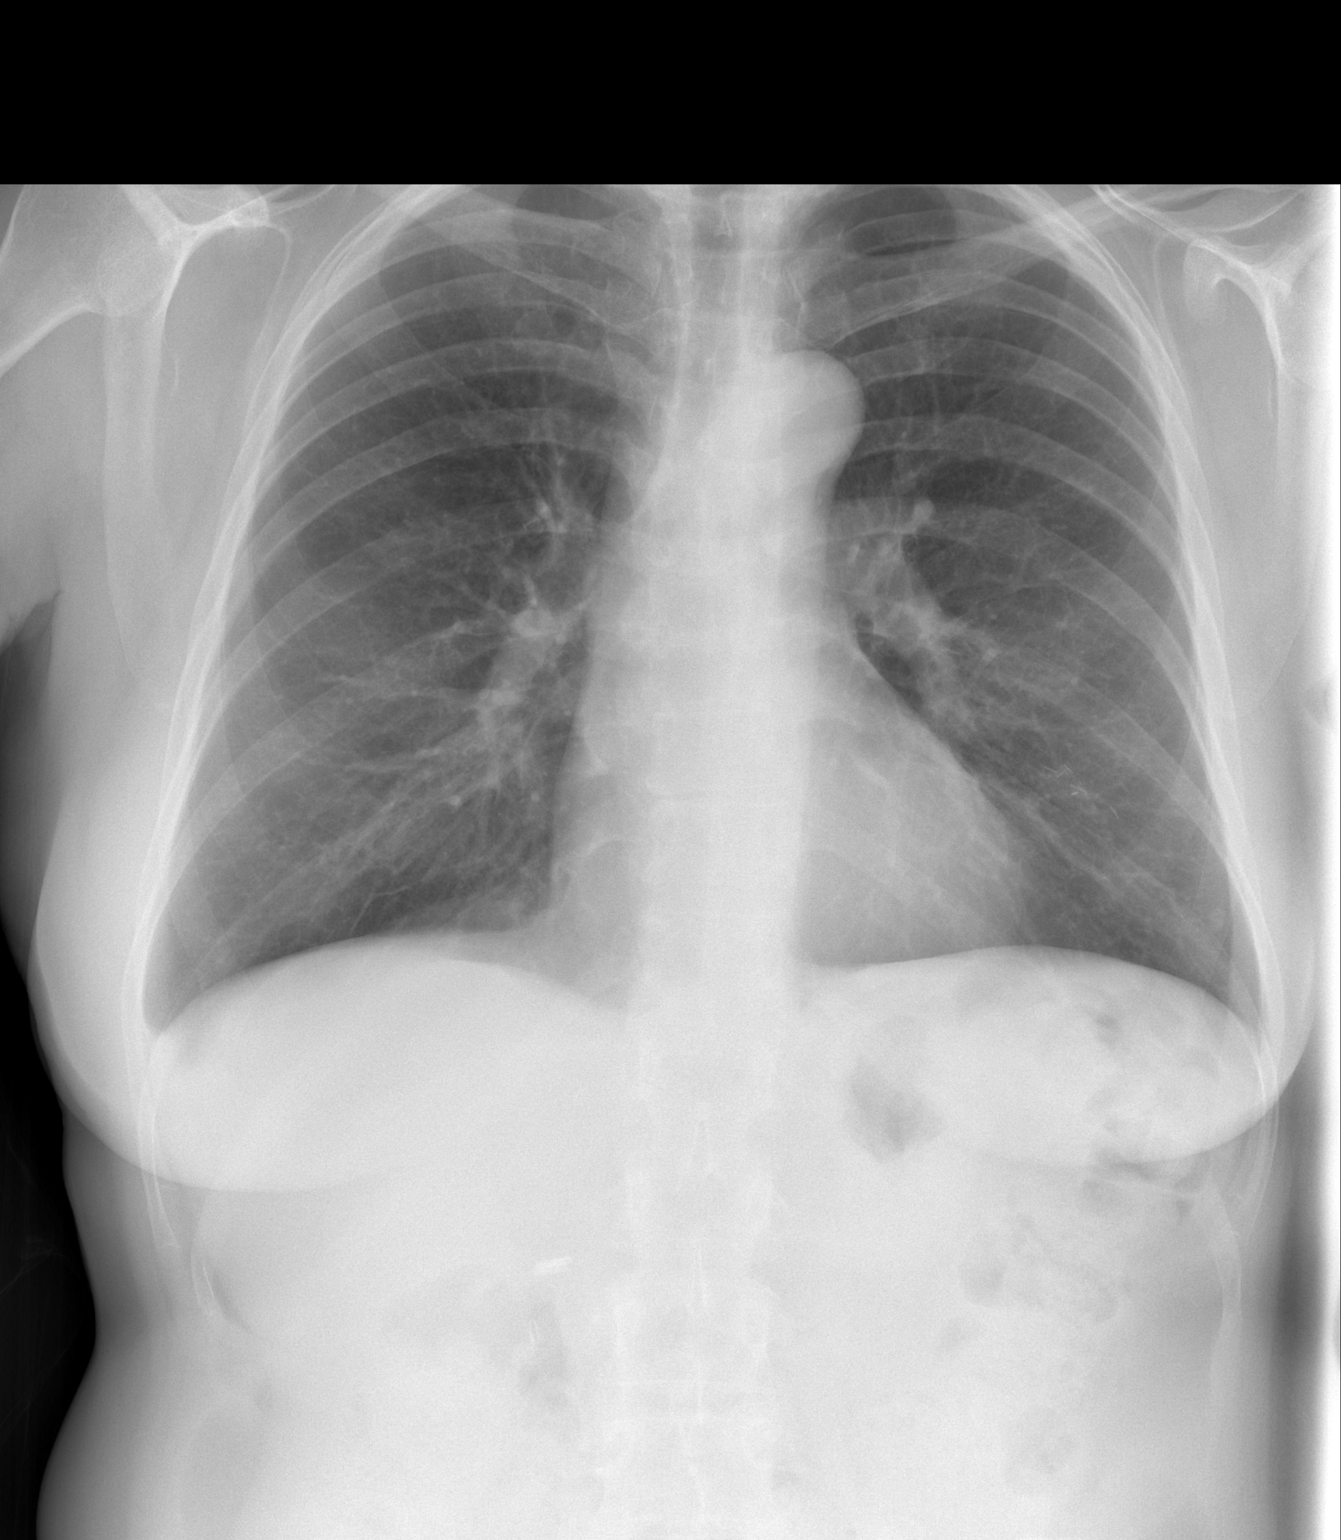

[w chest lat]
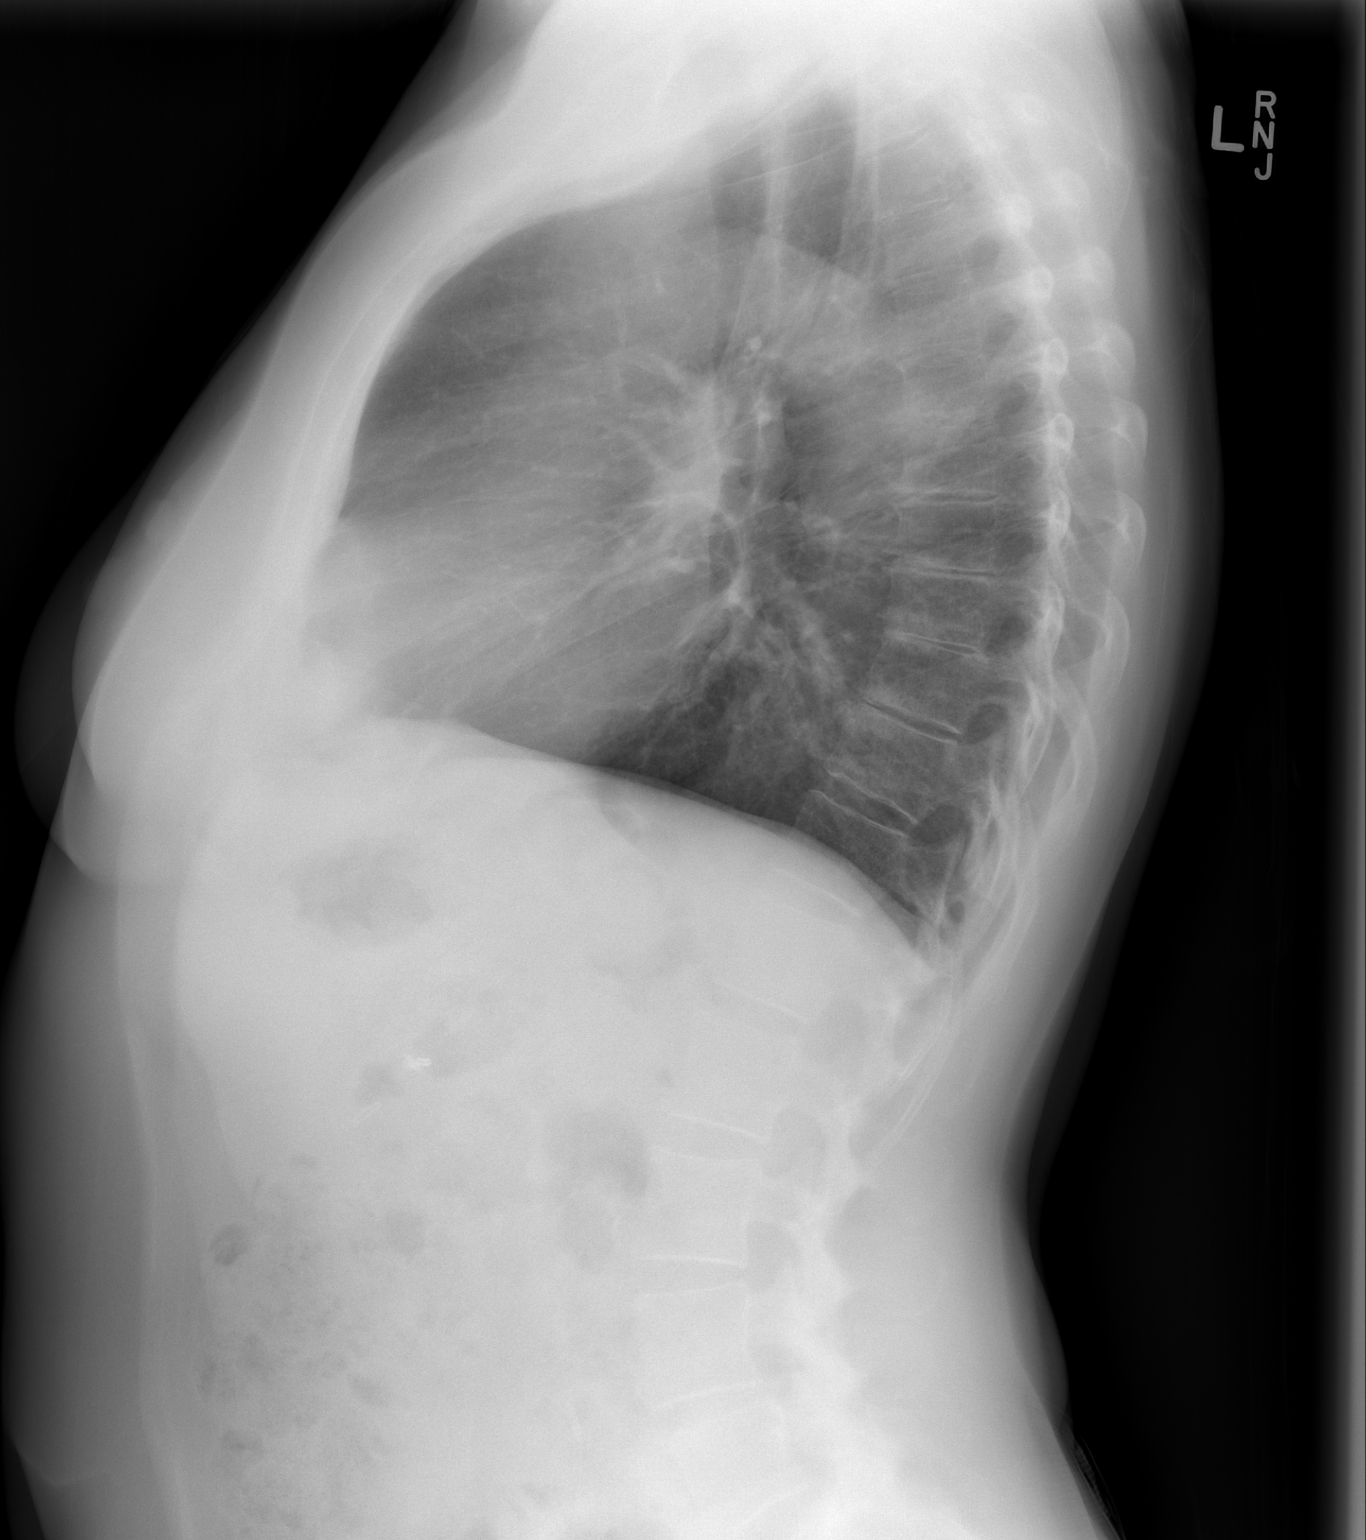

[2 of 2 positions shown; findings below may reference images not displayed]

FINDINGS: The heart size and mediastinal contours are within normal limits.
Both lungs are clear. No pneumothorax or pleural effusion is noted.
Mild degenerative changes are noted in mid and lower thoracic spine.
IMPRESSION: No acute cardiopulmonary abnormality seen.

## 2015-08-21 ENCOUNTER — Other Ambulatory Visit: Payer: Self-pay | Admitting: Oncology

## 2015-10-21 ENCOUNTER — Other Ambulatory Visit: Payer: Self-pay | Admitting: Oncology

## 2015-10-21 DIAGNOSIS — Z853 Personal history of malignant neoplasm of breast: Secondary | ICD-10-CM

## 2015-11-17 ENCOUNTER — Ambulatory Visit
Admission: RE | Admit: 2015-11-17 | Discharge: 2015-11-17 | Disposition: A | Discharge status: Home / Self Care | Payer: 59 | Source: Ambulatory Visit | Attending: Oncology | Admitting: Oncology

## 2015-11-17 DIAGNOSIS — Z853 Personal history of malignant neoplasm of breast: Secondary | ICD-10-CM

## 2016-02-27 ENCOUNTER — Observation Stay (HOSPITAL_COMMUNITY)
Admission: EM | Admit: 2016-02-27 | Discharge: 2016-02-28 | Disposition: A | Discharge status: Home / Self Care | Payer: Managed Care, Other (non HMO) | Attending: Internal Medicine | Admitting: Internal Medicine

## 2016-02-27 ENCOUNTER — Emergency Department (HOSPITAL_COMMUNITY): Payer: Managed Care, Other (non HMO)

## 2016-02-27 ENCOUNTER — Encounter (HOSPITAL_COMMUNITY): Payer: Self-pay | Admitting: Family Medicine

## 2016-02-27 DIAGNOSIS — R079 Chest pain, unspecified: Secondary | ICD-10-CM | POA: Diagnosis not present

## 2016-02-27 DIAGNOSIS — I10 Essential (primary) hypertension: Secondary | ICD-10-CM | POA: Diagnosis not present

## 2016-02-27 DIAGNOSIS — Z79899 Other long term (current) drug therapy: Secondary | ICD-10-CM | POA: Insufficient documentation

## 2016-02-27 DIAGNOSIS — Z853 Personal history of malignant neoplasm of breast: Secondary | ICD-10-CM | POA: Diagnosis not present

## 2016-02-27 DIAGNOSIS — E785 Hyperlipidemia, unspecified: Secondary | ICD-10-CM | POA: Diagnosis present

## 2016-02-27 DIAGNOSIS — Z7982 Long term (current) use of aspirin: Secondary | ICD-10-CM | POA: Insufficient documentation

## 2016-02-27 DIAGNOSIS — C50912 Malignant neoplasm of unspecified site of left female breast: Secondary | ICD-10-CM | POA: Diagnosis present

## 2016-02-27 DIAGNOSIS — R072 Precordial pain: Principal | ICD-10-CM | POA: Insufficient documentation

## 2016-02-27 DIAGNOSIS — E782 Mixed hyperlipidemia: Secondary | ICD-10-CM | POA: Diagnosis present

## 2016-02-27 LAB — I-STAT TROPONIN, ED
TROPONIN I, POC: 0.02 ng/mL (ref 0.00–0.08)
Troponin i, poc: 0.01 ng/mL (ref 0.00–0.08)

## 2016-02-27 LAB — COMPREHENSIVE METABOLIC PANEL
ALT: 17 U/L (ref 14–54)
ANION GAP: 7 (ref 5–15)
AST: 24 U/L (ref 15–41)
Albumin: 4 g/dL (ref 3.5–5.0)
Alkaline Phosphatase: 48 U/L (ref 38–126)
BILIRUBIN TOTAL: 0.5 mg/dL (ref 0.3–1.2)
BUN: 18 mg/dL (ref 6–20)
CO2: 25 mmol/L (ref 22–32)
Calcium: 9.6 mg/dL (ref 8.9–10.3)
Chloride: 109 mmol/L (ref 101–111)
Creatinine, Ser: 1.11 mg/dL — ABNORMAL HIGH (ref 0.44–1.00)
GFR calc Af Amer: 59 mL/min — ABNORMAL LOW (ref >60)
GFR, EST NON AFRICAN AMERICAN: 51 mL/min — AB (ref >60)
Glucose, Bld: 83 mg/dL (ref 65–99)
POTASSIUM: 4.1 mmol/L (ref 3.5–5.1)
Sodium: 141 mmol/L (ref 135–145)
TOTAL PROTEIN: 6.4 g/dL — AB (ref 6.5–8.1)

## 2016-02-27 LAB — CBC
HCT: 38.6 % (ref 36.0–46.0)
Hemoglobin: 13.1 g/dL (ref 12.0–15.0)
MCH: 32.3 pg (ref 26.0–34.0)
MCHC: 33.9 g/dL (ref 30.0–36.0)
MCV: 95.3 fL (ref 78.0–100.0)
PLATELETS: 249 10*3/uL (ref 150–400)
RBC: 4.05 MIL/uL (ref 3.87–5.11)
RDW: 12.9 % (ref 11.5–15.5)
WBC: 10.2 10*3/uL (ref 4.0–10.5)

## 2016-02-27 LAB — TROPONIN I: Troponin I: <0.03 ng/mL (ref <0.03)

## 2016-02-27 LAB — LIPASE, BLOOD: LIPASE: 34 U/L (ref 11–51)

## 2016-02-27 LAB — D-DIMER, QUANTITATIVE: D-Dimer, Quant: 0.44 ug/mL-FEU (ref 0.00–0.50)

## 2016-02-27 MED ORDER — PANTOPRAZOLE SODIUM 40 MG PO TBEC
40.0000 mg | DELAYED_RELEASE_TABLET | Freq: Every day | ORAL | Status: DC
  Administered 2016-02-27: 40 mg via ORAL
  Filled 2016-02-27: qty 1

## 2016-02-27 MED ORDER — LISINOPRIL 10 MG PO TABS
10.0000 mg | ORAL_TABLET | Freq: Every day | ORAL | Status: DC
  Administered 2016-02-27: 10 mg via ORAL
  Filled 2016-02-27: qty 1

## 2016-02-27 MED ORDER — ASPIRIN EC 81 MG PO TBEC
81.0000 mg | DELAYED_RELEASE_TABLET | Freq: Every day | ORAL | Status: DC
  Administered 2016-02-28: 81 mg via ORAL
  Filled 2016-02-27: qty 1

## 2016-02-27 MED ORDER — VENLAFAXINE HCL ER 37.5 MG PO CP24
37.5000 mg | ORAL_CAPSULE | Freq: Every day | ORAL | Status: DC
  Administered 2016-02-27 – 2016-02-28 (×2): 37.5 mg via ORAL
  Filled 2016-02-27 (×2): qty 1

## 2016-02-27 MED ORDER — SIMVASTATIN 20 MG PO TABS
20.0000 mg | ORAL_TABLET | Freq: Every day | ORAL | Status: DC
  Administered 2016-02-27: 20 mg via ORAL
  Filled 2016-02-27: qty 1

## 2016-02-27 MED ORDER — ACETAMINOPHEN 325 MG PO TABS: 650.0000 mg | ORAL_TABLET | ORAL | Status: DC | PRN

## 2016-02-27 MED ORDER — ENOXAPARIN SODIUM 40 MG/0.4ML ~~LOC~~ SOLN
40.0000 mg | SUBCUTANEOUS | Status: DC
  Administered 2016-02-27: 40 mg via SUBCUTANEOUS
  Filled 2016-02-27: qty 0.4

## 2016-02-27 MED ORDER — ONDANSETRON HCL 4 MG/2ML IJ SOLN: 4.0000 mg | Freq: Four times a day (QID) | INTRAMUSCULAR | Status: DC | PRN

## 2016-02-27 MED ORDER — GI COCKTAIL ~~LOC~~: 30.0000 mL | Freq: Four times a day (QID) | ORAL | Status: DC | PRN

## 2016-02-27 NOTE — ED Notes (Signed)
Pt returned from CXR

## 2016-02-27 NOTE — H&P (Signed)
History and Physical  Patient Name: Carolyn Lowe     BSJ:[id number]    DOB: [birth year]    DOA: 02/27/2016 PCP: Thressa Sheller, MD   Patient coming from: Work     Chief Complaint: Chest pain  HPI: [name] is a 65 y.o. female with a past medical history significant for HTN, hyperlipidemia, former smoker, and L breast CA s/p radiation who presents with chest pain.  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  As she was climbing up and down the stairs to the seats, she started to sweat profusely, then became significantly short of breath, having to stop and rest frequently, then noticed a central moderate chest tightness, and the feeling that she was about to "pass out". She had to leave her shift early and go up to the main promenade to the EMS tent, where they said she looked bad, pale and sweaty.  She sat down, they gave her aspirin 325 mg, no nitro, and her symptoms slowly resolved after ~20 minutes.  Has been feeling more tired than usual lately, but is very active and has had no specific chest symptoms.  Had "not enough water to drink today".  ED course: -Afebrile, heart rate 72, respirations and pulse ox normal, BP 126/90 -Initial ECG showed nonspecific old anterior infarct pattern without T wave or ST changes and troponin was negative. -Na 141, K 4.1, Cr 1.1 (baseline 1.0), WBC 10.2, Hgb 13.1 -D-dimer negative, lipase normal -CXR clear -TRH was asked to admit for observation, serial troponins and risk stratification.    Her father had bypass surgery in his 58s and 2 stents after that. Her mother had a heart attack in her 50s or 58s. She has had no previous heart attack, stroke, peripheral vascular disease. She was a smoker for 40 years, quit 10 years ago. Now uses e-cigs.  Has HTN and hyperlipidemia, well-controlled.            Review of Systems:  Review of Systems  Constitutional: Positive for diaphoresis and malaise/fatigue.  Respiratory: Positive for shortness of breath.   Cardiovascular: Positive for chest pain. Negative for palpitations, orthopnea, claudication, leg swelling and PND.  All other systems reviewed and are negative.    Past Medical History:  Diagnosis Date  . Arthritis   . Brain aneurysm    Resolved spontaneously- IT SEALED IT SELF - PT WAS IN HER EARLY 30'S   . Breast cancer ([place])   . Colon polyp, hyperplastic   . Depression    DEPRESSION RELATED TO HX BREAST CANCER/ ARIMEDEX SIDE EFFECTS- NO LONGER ON ARIMEDEX - AND DOING BETTER  . GERD (gastroesophageal reflux disease)   . Hypercholesterolemia   . Hypertension   . Right trigger finger     Past Surgical History:  Procedure Laterality Date  . ABDOMINAL HYSTERECTOMY  1984  . BREAST SURGERY  2010   LEFT BREAST LUMPECTOMY AND AXILLARY NODE DISSECTION  . CARPAL TUNNEL RELEASE Right 2013  . CHOLECYSTECTOMY  1999  . INCISIONAL HERNIA REPAIR N/A 10/22/2013   Procedure: LAPAROSCOPIC INCISIONAL HERNIA;  Surgeon: Harl Bowie, MD;  Location: [facility];  Service: General;  Laterality: N/A;  . INSERTION OF MESH N/A 10/22/2013   Procedure: INSERTION OF MESH;  Surgeon: Harl Bowie, MD;  Location: [facility];  Service: General;  Laterality: N/A;  . NASAL SINUS SURGERY  1987  .  right knee arthroscopy Right 2005    Social History: Patient lives with her husband.  Patient walks unassisted.  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  Former smoker.    No Known Allergies  Family history: family history includes Atrial fibrillation in her mother; Heart attack (age of onset: 32) in her mother; Heart disease in her father and paternal grandfather.  Prior to Admission medications   Medication Sig Start Date End Date Taking? Authorizing Provider  aspirin EC 81 MG tablet Take 81 mg by mouth daily.   Yes  Historical Provider, MD  lisinopril (PRINIVIL,ZESTRIL) 10 MG tablet Take 10 mg by mouth at bedtime.  08/03/12  Yes Historical Provider, MD  pantoprazole (PROTONIX) 40 MG tablet Take 40 mg by mouth at bedtime.    Yes Historical Provider, MD  simvastatin (ZOCOR) 20 MG tablet Take 20 mg by mouth at bedtime.    Yes Historical Provider, MD  venlafaxine XR (EFFEXOR-XR) 37.5 MG 24 hr capsule Take 1 capsule by mouth  daily 08/23/15  Yes Chauncey Cruel, MD  FIBER SELECT GUMMIES PO Take 1 capsule by mouth daily.     Historical Provider, MD  Ginkgo Biloba 40 MG TABS Take 40 mg by mouth daily.     Historical Provider, MD  Glucosamine-Chondroit-Vit C-Mn (GLUCOSAMINE 1500 COMPLEX PO) Take 1 tablet by mouth daily.     Historical Provider, MD  Multiple Vitamin (MULTIVITAMIN WITH MINERALS) TABS tablet Take 1 tablet by mouth daily.    Historical Provider, MD  traZODone (DESYREL) 50 MG tablet  12/30/13   Historical Provider, MD  vitamin E 1000 UNIT capsule Take 1,000 Units by mouth daily.    Historical Provider, MD       Physical Exam: BP 130/86   Pulse 67   Temp 98.2 F (36.8 C) (Oral)   Resp 21   SpO2 96%  General appearance: Well-developed, adult female, alert and in no acute distress.   Eyes: Anicteric, conjunctiva pink, lids and lashes normal.     ENT: No nasal deformity, discharge, or epistaxis.  OP moist without lesions.   Skin: Warm and dry.   Cardiac: RRR, nl S1-S2, no murmurs appreciated.  Capillary refill is brisk.  JVP normal.  No LE edema.  Radial and DP pulses 2+ and symmetric.  No carotid bruits. Respiratory: Normal respiratory rate and rhythm.  CTAB without rales or wheezes. GI: Abdomen soft without rigidity.  No TTP. No ascites, distension.   MSK: No deformities or effusions.   Pain not reproduced with palpation of precordium.  No pain with arm movement. Neuro: Sensorium intact and responding to questions, attention normal.  Speech is fluent.  Moves all extremities equally and with  normal coordination.    Psych: Behavior appropriate.  Affect normal.  No evidence of aural or visual hallucinations or delusions.       Labs on Admission:  The metabolic panel shows CKD baseline Cr 1.0, eGFR 50s to 60s. The complete blood count shows no leukocytosis, anemia, thrombus cytopenia. The initial troponin is negative. D-dimer negative. Lipase normal.  Radiological Exams on Admission: Personally reviewed CXR shows no opacity: Dg Chest 2 View  Result Date: 02/27/2016 CLINICAL DATA:  Shortness of breath.  Chest pain. EXAM: CHEST  2 VIEW COMPARISON:  October 16, 2013 FINDINGS: The heart size and mediastinal contours are within normal limits. Both lungs are clear. The visualized skeletal structures are unremarkable. IMPRESSION: No active cardiopulmonary disease. Electronically Signed   By: Shanon Brow  Jimmye Norman III M.D   On: 02/27/2016 17:30    EKG: Independently reviewed. Rate 71, QTc 428, anterior old infarct pattern, no ST or TW changes.    Assessment/Plan  1. Chest pain: Overall, the episode sounds something like a vasovagal episode in someone who didn't drink enough water.    However, she also has chest tightness that is new, has HEART score of 4 with risk factors for coronary disease HTN, hyperlipidemia, former smoker, post-menopausal, post Left breast radiation and anastrozole+tamoxifen and family history in both parents before age 59.   Pain was exertional, relieved with rest.  Other potential causes of chest pain (PE, dissection, pancreatitis, pneumonia/effusion, pericarditis) are doubted.  We have been asked to admit the patient for observation and etiology consultation with Cardiology tomorrow.    -Serial troponins are ordered -Telemetry -Consult to cardiology, appreciate recommendations -Patient is no longer a smoker, does use e-cigs.    -Continue statin, check lipids -Continue aspirin     2. HTN:  -Continue lisinopril  3. Other medications:  -Continue  PPI -Continue venlafaxine (if troponins turn positive, or stress test positive, this should be discontinued until this episode is over)            DVT prophylaxis: Lovenox Diet: NPO after 4am for anticipated stress testing Code Status: FULL  Family Communication: Husband and daughter at bedside  Disposition Plan: Anticipate overnight observation for arrhythmia on telemetry, serial troponins and subsequent risk stratification by Cardiology.  If testing negative, home after. Consults called: Cardiology, via Inbasket Admission status: Telemetry, OBS   Medical decision making: Patient seen at 8:18 PM on 02/27/2016.  The patient was discussed with Dr. Tyrone Nine. What exists of the patient's chart was reviewed in depth.  Clinical condition: stable.      Edwin Dada [facility] Pager [phone]

## 2016-02-27 NOTE — ED Triage Notes (Signed)
Pt to ED via [facility] who states pt was [place] [employer] [place] people when she developed chest tightness, shortness of breath and diaphoresis.   Pt st's she normally drinks a lot of water but had not drank any today.  After EMS arrived all symptoms resolved.  Pt denies any complaints at this time.  EMS gave pt ASA 324mg .

## 2016-02-27 NOTE — ED Provider Notes (Signed)
[facility] DEPT Provider Note   CSN: [id number] Arrival date & time: 02/27/16  1640     History   Chief Complaint Chief Complaint  Patient presents with  . Chest Pain    HPI Carolyn Lowe is a 65 y.o. female.  65 yo F with a chief complaint of chest pain. She described this as a pressure she got diaphoretic with it and short of breath. She was nauseated but denies vomiting. She had no radiation of the pain. This occurred while she was going up and down steps showing pubic to their seats and agrees protocol scan. Patient was noted to look very ill by other attendees and she was rushed to the EMS room. There she got water and rested for about 40 minutes and had significant improvement of her symptoms. EMS was called and had a normal EKG and transported to the hospital for evaluation. Patient denies prior MI in the past denies leg swelling denies hemoptysis denies prior PE. Patient has a remote history of breast cancer and has been consider breast cancer free for quite some time. She is no longer smoker but has hypertension hyperlipidemia. Denies diabetes.   The history is provided by the patient.  Chest Pain   This is a new problem. The current episode started less than 1 hour ago. The problem occurs constantly. The problem has not changed since onset.The pain is associated with exertion. The pain is present in the substernal region. The pain is at a severity of 8/10. The pain is severe. The quality of the pain is described as exertional and pressure-like. The pain does not radiate. Duration of episode(s) is 1 hour. The symptoms are aggravated by exertion. Associated symptoms include exertional chest pressure, malaise/fatigue, nausea and shortness of breath. She has tried rest for the symptoms. The treatment provided significant relief. Risk factors include being elderly.  Her past medical history is significant for hyperlipidemia and hypertension.  Pertinent negatives for past  medical history include no CHF, no MI and no PE.  Her family medical history is significant for early MI (father 39).    Past Medical History:  Diagnosis Date  . Arthritis   . Brain aneurysm    Resolved spontaneously- IT SEALED IT SELF - PT WAS IN HER EARLY 30'S   . Breast cancer ([place])   . Colon polyp, hyperplastic   . Depression    DEPRESSION RELATED TO HX BREAST CANCER/ ARIMEDEX SIDE EFFECTS- NO LONGER ON ARIMEDEX - AND DOING BETTER  . GERD (gastroesophageal reflux disease)   . Hypercholesterolemia   . Hypertension   . Right trigger finger     Patient Active Problem List   Diagnosis Date Noted  . Chest pain with high risk for cardiac etiology 02/27/2016  . Essential hypertension 02/27/2016  . Hyperlipidemia 02/27/2016  . Breast cancer, left breast ([place]) 01/14/2014  . Incisional hernia 10/22/2013  . Incisional hernia, without obstruction or gangrene 09/24/2013  . Insomnia 02/03/2013    Past Surgical History:  Procedure Laterality Date  . ABDOMINAL HYSTERECTOMY  1984  . BREAST SURGERY  2010   LEFT BREAST LUMPECTOMY AND AXILLARY NODE DISSECTION  . CARPAL TUNNEL RELEASE Right 2013  . CHOLECYSTECTOMY  1999  . INCISIONAL HERNIA REPAIR N/A 10/22/2013   Procedure: LAPAROSCOPIC INCISIONAL HERNIA;  Surgeon: Harl Bowie, MD;  Location: [facility];  Service: General;  Laterality: N/A;  . INSERTION OF MESH N/A 10/22/2013   Procedure: INSERTION OF MESH;  Surgeon: Harl Bowie, MD;  Location:  [facility];  Service: General;  Laterality: N/A;  . NASAL SINUS SURGERY  1987  . right knee arthroscopy Right 2005    OB History    No data available       Home Medications    Prior to Admission medications   Medication Sig Start Date End Date Taking? Authorizing Provider  aspirin EC 81 MG tablet Take 81 mg by mouth daily.   Yes Historical Provider, MD  FIBER SELECT GUMMIES PO Take 1 capsule by mouth daily.    Yes Historical Provider, MD  Ginkgo Biloba 40 MG TABS Take 40 mg by mouth  daily.    Yes Historical Provider, MD  Glucosamine-Chondroit-Vit C-Mn (GLUCOSAMINE 1500 COMPLEX PO) Take 1 tablet by mouth daily.    Yes Historical Provider, MD  lisinopril (PRINIVIL,ZESTRIL) 10 MG tablet Take 10 mg by mouth at bedtime.  08/03/12  Yes Historical Provider, MD  Multiple Vitamin (MULTIVITAMIN WITH MINERALS) TABS tablet Take 1 tablet by mouth daily.   Yes Historical Provider, MD  pantoprazole (PROTONIX) 40 MG tablet Take 40 mg by mouth at bedtime.    Yes Historical Provider, MD  simvastatin (ZOCOR) 20 MG tablet Take 20 mg by mouth at bedtime.    Yes Historical Provider, MD  venlafaxine XR (EFFEXOR-XR) 37.5 MG 24 hr capsule Take 1 capsule by mouth  daily 08/23/15  Yes Chauncey Cruel, MD  vitamin E 1000 UNIT capsule Take 1,000 Units by mouth daily.   Yes Historical Provider, MD  traZODone (DESYREL) 50 MG tablet  12/30/13   Historical Provider, MD    Family History Family History  Problem Relation Age of Onset  . Heart attack Mother 43  . Atrial fibrillation Mother   . Heart disease Father     CABG 4V, two stents since  . Heart disease Paternal Grandfather     Social History Social History  Substance Use Topics  . Smoking status: Never Smoker  . Smokeless tobacco: Never Used  . Alcohol use No     Allergies   Patient has no known allergies.   Review of Systems Review of Systems  Constitutional: Positive for malaise/fatigue.  Respiratory: Positive for shortness of breath.   Cardiovascular: Positive for chest pain.  Gastrointestinal: Positive for nausea.     Physical Exam Updated Vital Signs BP (!) 153/83 (BP Location: Right Arm)   Pulse 67   Temp 97.5 F (36.4 C) (Oral)   Resp 18   Ht 5\' 2"  (1.575 m)   Wt 130 lb (59 kg)   SpO2 95%   BMI 23.78 kg/m   Physical Exam  Constitutional: She is oriented to person, place, and time. She appears well-developed and well-nourished. No distress.  HENT:  Head: Normocephalic and atraumatic.  Eyes: EOM are normal.  Pupils are equal, round, and reactive to light.  Neck: Normal range of motion. Neck supple.  Cardiovascular: Normal rate and regular rhythm.  Exam reveals no gallop and no friction rub.   No murmur heard. Pulmonary/Chest: Effort normal. She has no wheezes. She has no rales.  Abdominal: Soft. She exhibits no distension and no mass. There is no tenderness. There is no guarding.  Musculoskeletal: She exhibits no edema or tenderness.  Neurological: She is alert and oriented to person, place, and time.  Skin: Skin is warm and dry. She is not diaphoretic.  Psychiatric: She has a normal mood and affect. Her behavior is normal.  Nursing note and vitals reviewed.    ED Treatments / Results  Labs (all labs ordered are listed, but only abnormal results are displayed) Labs Reviewed  COMPREHENSIVE METABOLIC PANEL - Abnormal; Notable for the following:       Result Value   Creatinine, Ser 1.11 (*)    Total Protein 6.4 (*)    GFR calc non Af Amer 51 (*)    GFR calc Af Amer 59 (*)    All other components within normal limits  CBC  D-DIMER, QUANTITATIVE (NOT AT [facility])  LIPASE, BLOOD  TROPONIN I  TROPONIN I  TROPONIN I  LIPID PANEL  I-STAT TROPOININ, ED  I-STAT TROPOININ, ED    EKG  EKG Interpretation  Date/Time:  Sunday February 27 2016 16:49:15 EST Ventricular Rate:  71 PR Interval:    QRS Duration: 68 QT Interval:  393 QTC Calculation: 428 R Axis:   45 Text Interpretation:  Sinus rhythm Anterior infarct, old voltage decreased since last tracing Otherwise no significant change Confirmed by  MD, DANIEL ([id number]) on 02/27/2016 4:55:53 PM       Radiology Dg Chest 2 View  Result Date: 02/27/2016 CLINICAL DATA:  Shortness of breath.  Chest pain. EXAM: CHEST  2 VIEW COMPARISON:  October 16, 2013 FINDINGS: The heart size and mediastinal contours are within normal limits. Both lungs are clear. The visualized skeletal structures are unremarkable. IMPRESSION: No active cardiopulmonary  disease. Electronically Signed   By: David  Williams III M.D   On: 02/27/2016 17:30    Procedures Procedures (including critical care time)  Medications Ordered in ED Medications  venlafaxine XR (EFFEXOR-XR) 24 hr capsule 37.5 mg (37.5 mg Oral Given 02/27/16 2243)  aspirin EC tablet 81 mg (0 mg Oral Hold 02/27/16 2241)  simvastatin (ZOCOR) tablet 20 mg (20 mg Oral Given 02/27/16 2241)  lisinopril (PRINIVIL,ZESTRIL) tablet 10 mg (10 mg Oral Given 02/27/16 2241)  pantoprazole (PROTONIX) EC tablet 40 mg (40 mg Oral Given 02/27/16 2241)  gi cocktail (Maalox,Lidocaine,Donnatal) (not administered)  acetaminophen (TYLENOL) tablet 650 mg (not administered)  ondansetron (ZOFRAN) injection 4 mg (not administered)  enoxaparin (LOVENOX) injection 40 mg (40 mg Subcutaneous Given 02/27/16 2242)     Initial Impression / Assessment and Plan / ED Course  I have reviewed the triage vital signs and the nursing notes.  Pertinent labs & imaging results that were available during my care of the patient were reviewed by me and considered in my medical decision making (see chart for details).  Clinical Course     65  yo F With a chief complaint of chest pain on exertion. Patient's symptoms sound concerning for ACS. Patient's EKG has no significant findings she is pain-free on arrival to the ED she got aspirin en route by EMS. Patient's initial troponin is negative. Her heart score is a 6. Will obtain a d-dimer with a remote history of cancer.  Ddimer negative.  Admit.   The patients results and plan were reviewed and discussed.   Any x-rays performed were independently reviewed by myself.   Differential diagnosis were considered with the presenting HPI.  Medications  venlafaxine XR (EFFEXOR-XR) 24 hr capsule 37.5 mg (37.5 mg Oral Given 02/27/16 2243)  aspirin EC tablet 81 mg (0 mg Oral Hold 02/27/16 2241)  simvastatin (ZOCOR) tablet 20 mg (20 mg Oral Given 02/27/16 2241)  lisinopril  (PRINIVIL,ZESTRIL) tablet 10 mg (10 mg Oral Given 02/27/16 2241)  pantoprazole (PROTONIX) EC tablet 40 mg (40 mg Oral Given 02/27/16 2241)  gi cocktail (Maalox,Lidocaine,Donnatal) (not administered)  acetaminophen (TYLENOL) tablet 650 mg (not administered)  ondansetron [facility]) injection 4 mg (not administered)  enoxaparin (LOVENOX) injection 40 mg (40 mg Subcutaneous Given 02/27/16 2242)    Vitals:   02/27/16 1915 02/27/16 1930 02/27/16 2050 02/27/16 2052  BP: 126/78 130/86  (!) 153/83  Pulse: 68 67    Resp: 22 21 18    Temp:    97.5 F (36.4 C)  TempSrc:    Oral  SpO2: 97% 96% 95% 95%  Weight:   130 lb (59 kg)   Height:   5\' 2"  (1.575 m)     Final diagnoses:  Chest pain with high risk for cardiac etiology    Admission/ observation were discussed with the admitting physician, patient and/or family and they are comfortable with the plan.    Final Clinical Impressions(s) / ED Diagnoses   Final diagnoses:  Chest pain with high risk for cardiac etiology    New Prescriptions Current Discharge Medication List       Deno Etienne, DO 02/28/16 0011

## 2016-02-28 ENCOUNTER — Observation Stay (HOSPITAL_BASED_OUTPATIENT_CLINIC_OR_DEPARTMENT_OTHER): Payer: Managed Care, Other (non HMO)

## 2016-02-28 DIAGNOSIS — R079 Chest pain, unspecified: Secondary | ICD-10-CM

## 2016-02-28 DIAGNOSIS — I10 Essential (primary) hypertension: Secondary | ICD-10-CM

## 2016-02-28 DIAGNOSIS — R9431 Abnormal electrocardiogram [ECG] [EKG]: Secondary | ICD-10-CM

## 2016-02-28 DIAGNOSIS — E785 Hyperlipidemia, unspecified: Secondary | ICD-10-CM | POA: Diagnosis not present

## 2016-02-28 LAB — NM MYOCAR MULTI W/SPECT W/WALL MOTION / EF
CHL CUP NUCLEAR SRS: 6
CSEPEW: 1 METS
Exercise duration (min): 5 min
Exercise duration (sec): 16 s
LHR: 0
LV sys vol: 16 mL
LVDIAVOL: 59 mL (ref 46–106)
Peak HR: 97 {beats}/min
Rest HR: 53 {beats}/min
SDS: 0
SSS: 6
TID: 1.39

## 2016-02-28 LAB — LIPID PANEL
CHOL/HDL RATIO: 3.2 ratio
CHOLESTEROL: 158 mg/dL (ref 0–200)
HDL: 49 mg/dL (ref >40)
LDL CALC: 59 mg/dL (ref 0–99)
Triglycerides: 248 mg/dL — ABNORMAL HIGH (ref <150)
VLDL: 50 mg/dL — AB (ref 0–40)

## 2016-02-28 LAB — ECHOCARDIOGRAM COMPLETE
HEIGHTINCHES: 62 in
Weight: 2080 oz

## 2016-02-28 LAB — TROPONIN I
Troponin I: <0.03 ng/mL (ref <0.03)
Troponin I: <0.03 ng/mL (ref <0.03)

## 2016-02-28 MED ORDER — REGADENOSON 0.4 MG/5ML IV SOLN
INTRAVENOUS | Status: AC
  Filled 2016-02-28: qty 5

## 2016-02-28 MED ORDER — TECHNETIUM TC 99M TETROFOSMIN IV KIT
10.0000 | PACK | Freq: Once | INTRAVENOUS | Status: AC | PRN
  Administered 2016-02-28: 10 via INTRAVENOUS

## 2016-02-28 MED ORDER — AMINOPHYLLINE 25 MG/ML IV SOLN
INTRAVENOUS | Status: AC
  Filled 2016-02-28: qty 10

## 2016-02-28 MED ORDER — TECHNETIUM TC 99M TETROFOSMIN IV KIT
30.0000 | PACK | Freq: Once | INTRAVENOUS | Status: AC | PRN
  Administered 2016-02-28: 30 via INTRAVENOUS

## 2016-02-28 MED ORDER — SODIUM CHLORIDE 0.9 % IV SOLN: INTRAVENOUS | Status: DC

## 2016-02-28 MED ORDER — REGADENOSON 0.4 MG/5ML IV SOLN
0.4000 mg | Freq: Once | INTRAVENOUS | Status: AC
  Administered 2016-02-28: 0.4 mg via INTRAVENOUS
  Filled 2016-02-28: qty 5

## 2016-02-28 NOTE — Consult Note (Signed)
CARDIOLOGY CONSULT NOTE   Patient ID: Carolyn Lowe MRN: [id number] DOB/AGE: [birth year] 65 y.o.  Admit date: 02/27/2016  Primary Physician   Thressa Sheller, MD Primary Cardiologist   New Reason for Consultation   Chest pain Requesting Physician  Dr. Eliseo Squires  HPI: Carolyn Lowe is a 65 y.o. female with a history of HTN, HLD, L breast CA s/p radiation, depression, former smoker and GERD who presented with chest pain.   No prior cardiac hx. Father had bypass in his 58s and stents afterwards. Mother had MI in 49s. Former smoker for 40 years who quit 10 years ago. Currently smoker e-cigs.   She was in [place] up-until yesterday afternoon when had substernal chest tightness while climbing up and down to stairs at [facility]. Associated with dyspnea and about to "pass out". She went to EMS tent where she was pale and sweaty. Given Aspirin only with resolution of pain. Recently tired and fatigue easily. No orthopnea, PND, syncope or LE edema.   EKG showed sinus rhythm with Q wave in anterior lead and non specific T wave changes. Scr of 1.1. D-dimer negative. CXR without acute cardiopulmonary disease. Troponin x 3 negative. HgB 13.1.    Past Medical History:  Diagnosis Date  . Arthritis   . Brain aneurysm    Resolved spontaneously- IT SEALED IT SELF - PT WAS IN HER EARLY 30'S   . Breast cancer ([place])   . Colon polyp, hyperplastic   . Depression    DEPRESSION RELATED TO HX BREAST CANCER/ ARIMEDEX SIDE EFFECTS- NO LONGER ON ARIMEDEX - AND DOING BETTER  . GERD (gastroesophageal reflux disease)   . Hypercholesterolemia   . Hypertension   . Right trigger finger      Past Surgical History:  Procedure Laterality Date  . ABDOMINAL HYSTERECTOMY  1984  . BREAST SURGERY  2010   LEFT BREAST LUMPECTOMY AND AXILLARY NODE DISSECTION  . CARPAL TUNNEL RELEASE Right 2013  . CHOLECYSTECTOMY  1999  . INCISIONAL HERNIA REPAIR N/A 10/22/2013   Procedure: LAPAROSCOPIC INCISIONAL HERNIA;  Surgeon:  Harl Bowie, MD;  Location: [facility];  Service: General;  Laterality: N/A;  . INSERTION OF MESH N/A 10/22/2013   Procedure: INSERTION OF MESH;  Surgeon: Harl Bowie, MD;  Location: [facility];  Service: General;  Laterality: N/A;  . NASAL SINUS SURGERY  1987  . right knee arthroscopy Right 2005    No Known Allergies  I have reviewed the patient's current medications . aspirin EC  81 mg Oral Daily  . enoxaparin (LOVENOX) injection  40 mg Subcutaneous Q24H  . lisinopril  10 mg Oral QHS  . pantoprazole  40 mg Oral QHS  . simvastatin  20 mg Oral QHS  . venlafaxine XR  37.5 mg Oral Daily   . sodium chloride     acetaminophen, gi cocktail, ondansetron (ZOFRAN) IV  Prior to Admission medications   Medication Sig Start Date End Date Taking? Authorizing Provider  aspirin EC 81 MG tablet Take 81 mg by mouth daily.   Yes Historical Provider, MD  FIBER SELECT GUMMIES PO Take 1 capsule by mouth daily.    Yes Historical Provider, MD  Ginkgo Biloba 40 MG TABS Take 40 mg by mouth daily.    Yes Historical Provider, MD  Glucosamine-Chondroit-Vit C-Mn (GLUCOSAMINE 1500 COMPLEX PO) Take 1 tablet by mouth daily.    Yes Historical Provider, MD  lisinopril (PRINIVIL,ZESTRIL) 10 MG tablet Take 10 mg by mouth at bedtime.  08/03/12  Yes Historical  Provider, MD  Multiple Vitamin (MULTIVITAMIN WITH MINERALS) TABS tablet Take 1 tablet by mouth daily.   Yes Historical Provider, MD  pantoprazole (PROTONIX) 40 MG tablet Take 40 mg by mouth at bedtime.    Yes Historical Provider, MD  simvastatin (ZOCOR) 20 MG tablet Take 20 mg by mouth at bedtime.    Yes Historical Provider, MD  venlafaxine XR (EFFEXOR-XR) 37.5 MG 24 hr capsule Take 1 capsule by mouth  daily 08/23/15  Yes Chauncey Cruel, MD  vitamin E 1000 UNIT capsule Take 1,000 Units by mouth daily.   Yes Historical Provider, MD  traZODone (DESYREL) 50 MG tablet  12/30/13   Historical Provider, MD     Social History   Social History  . Marital status:  Married    Spouse name: N/A  . Number of children: N/A  . Years of education: N/A   Occupational History  . Not on file.   Social History Main Topics  . Smoking status: Never Smoker  . Smokeless tobacco: Never Used  . Alcohol use No  . Drug use: No  . Sexual activity: Not on file   Other Topics Concern  . Not on file   Social History Narrative  . No narrative on file    Family Status  Relation Status  . Mother Alive  . Father Alive  . Brother Alive  . Brother Alive  . Paternal Grandfather    Family History  Problem Relation Age of Onset  . Heart attack Mother 26  . Atrial fibrillation Mother   . Heart disease Father     CABG 4V, two stents since  . Heart disease Paternal Grandfather       ROS:  Full 14 point review of systems complete and found to be negative unless listed above.  Physical Exam: Blood pressure (!) 153/83, pulse 67, temperature 97.5 F (36.4 C), temperature source Oral, resp. rate 18, height 5\' 2"  (1.575 m), weight 130 lb (59 kg), SpO2 95 %.  General: Well developed, well nourished, female in no acute distress Head: Eyes PERRLA, No xanthomas. Normocephalic and atraumatic, oropharynx without edema or exudate.  Lungs: Resp regular and unlabored, CTA. Heart: RRR no s3, s4, or murmurs..   Neck: No carotid bruits. No lymphadenopathy.  JVD. Abdomen: Bowel sounds present, abdomen soft and non-tender without masses or hernias noted. Msk:  No spine or cva tenderness. No weakness, no joint deformities or effusions. Extremities: No clubbing, cyanosis or edema. DP/PT/Radials 2+ and equal bilaterally. Neuro: Alert and oriented X 3. No focal deficits noted. Psych:  Good affect, responds appropriately Skin: No rashes or lesions noted.  Labs:   Lab Results  Component Value Date   WBC 10.2 02/27/2016   HGB 13.1 02/27/2016   HCT 38.6 02/27/2016   MCV 95.3 02/27/2016   PLT 249 02/27/2016   No results for input(s): INR in the last 72 hours.  Recent  Labs Lab 02/27/16 1751  NA 141  K 4.1  CL 109  CO2 25  BUN 18  CREATININE 1.11*  CALCIUM 9.6  PROT 6.4*  BILITOT 0.5  ALKPHOS 48  ALT 17  AST 24  GLUCOSE 83  ALBUMIN 4.0   No results found for: MG  Recent Labs  02/27/16 2129 02/28/16 0001 02/28/16 0233  TROPONINI <0.03 <0.03 <0.03    Recent Labs  02/27/16 1750 02/27/16 1952  TROPIPOC 0.02 0.01   No results found for: PROBNP No results found for: CHOL, HDL, LDLCALC, TRIG Lab Results  Component Value Date   DDIMER 0.44 02/27/2016   Lipase  Date/Time Value Ref Range Status  02/27/2016 05:51 PM 34 11 - 51 U/L Final   No results found for: TSH, T4TOTAL, T3FREE, THYROIDAB No results found for: VITAMINB12, FOLATE, FERRITIN, TIBC, IRON, RETICCTPCT  Echo: pending    Radiology:  Dg Chest 2 View  Result Date: 02/27/2016 CLINICAL DATA:  Shortness of breath.  Chest pain. EXAM: CHEST  2 VIEW COMPARISON:  October 16, 2013 FINDINGS: The heart size and mediastinal contours are within normal limits. Both lungs are clear. The visualized skeletal structures are unremarkable. IMPRESSION: No active cardiopulmonary disease. Electronically Signed   By: Dorise Bullion III M.D   On: 02/27/2016 17:30    ASSESSMENT AND PLAN:     1. Chest pain with high risk for cardiac etiology - The patient has ruled out. Recently tired. EKG with Q wave in anterior leads and non specific T wave changes. Cardiac risk factor includes HTN, HLD, former smoker and strong family hx of CAD. D-dimer negative.  - Will get myoview and echo.   2. HTN - Stable and well controlled. Continue lisinopril.  3. HLD - Per patient, its been controlled. Continue Zocor.   SignedLeanor Kail, PA 02/28/2016, 7:57 AM Pager [phone] Patient seen and examined. I agree with the assessment and plan as detailed above. See also my additional thoughts below.   I reviewed the history carefully with the patient. She works regularly at [employer] and has no  difficulties walking the stairs. Her episode yesterday is concerning. Troponins are normal. She did feel an increased heart rate when she was short of breath. However she could not sense rapid heart rate before her other symptoms began. Her EKG is abnormal. There is no definite change from the past, but old anterior MI cannot be ruled out. Considering all these factors, I feel it is appropriate to fully assess her LV function with echo and proceed with nuclear stress testing today in the hospital. This will be arranged carefully.  Dola Argyle, MD, [facility] 02/28/2016 8:32 AM

## 2016-02-28 NOTE — Progress Notes (Signed)
Stress test low risk with no evidence of prior infarct or ischemia. Echo showed normal EV function, grade 1 DD. No WM abnormality. Discussed with Dr. Burt Knack. No need for outpatient cardiology follow up. F/u with PCP.

## 2016-02-28 NOTE — Discharge Summary (Signed)
Physician Discharge Summary  Carolyn Lowe [id number] DOB: 1951-01-18 DOA: 02/27/2016  PCP: Thressa Sheller, MD  Admit date: 02/27/2016 Discharge date: 02/28/2016   Recommendations for Outpatient Follow-Up:   1. Risk factor modification   Discharge Diagnosis:   Principal Problem:   Chest pain with high risk for cardiac etiology Active Problems:   Breast cancer, left breast [facility])   Essential hypertension   Hyperlipidemia   Discharge disposition:  Home  Discharge Condition: Improved.  Diet recommendation: Low sodium, heart healthy  Wound care: None.   History of Present Illness:   Carolyn Lowe is a 65 y.o. female with a past medical history significant for HTN, hyperlipidemia, former smoker, and L breast CA s/p radiation who presents with chest pain.  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  As she was climbing up and down the stairs to the seats, she started to sweat profusely, then became significantly short of breath, having to stop and rest frequently, then noticed a central moderate chest tightness, and the feeling that she was about to "pass out". She had to leave her shift early and go up to the main promenade to the EMS tent, where they said she looked bad, pale and sweaty.  She sat down, they gave her aspirin 325 mg, no nitro, and her symptoms slowly resolved after ~20 minutes.  Has been feeling more tired than usual lately, but is very active and has had no specific chest symptoms.  Had "not enough water to drink today".   Hospital Course by Problem:   Chest pain with high risk for cardiac etiology - low risk stress test -echo: LVEF 65-70%, normal wall thickness, normal wall motion, diastolic   dysfunction, elevated LV filling pressure, normal LA Size,   trivial TR, RVSP 12 mmHg, normal IVC.  HTN - Stable  and well controlled. Continue lisinopril.   HLD - Per patient, its been controlled. Continue Zocor.    Medical Consultants:    cards   Discharge Exam:   Vitals:   02/28/16 0912 02/28/16 1100  BP: 123/78 124/67  Pulse:  61  Resp:    Temp:  97.6 F (36.4 C)   Vitals:   02/28/16 0909 02/28/16 0911 02/28/16 0912 02/28/16 1100  BP: 133/85 133/77 123/78 124/67  Pulse:    61  Resp:      Temp:    97.6 F (36.4 C)  TempSrc:    Oral  SpO2:    100%  Weight:      Height:        Gen:  NAD    The results of significant diagnostics from this hospitalization (including imaging, microbiology, ancillary and laboratory) are listed below for reference.     Procedures and Diagnostic Studies:   Dg Chest 2 View  Result Date: 02/27/2016 CLINICAL DATA:  Shortness of breath.  Chest pain. EXAM: CHEST  2 VIEW COMPARISON:  October 16, 2013 FINDINGS: The heart size and mediastinal contours are within normal limits. Both lungs are clear. The visualized skeletal structures are unremarkable. IMPRESSION: No active cardiopulmonary disease. Electronically Signed   By: Dorise Bullion III M.D   On: 02/27/2016 17:30   Nm Myocar Multi W/spect Tamela Oddi Motion / Ef  Result Date: 02/28/2016  There was no ST segment deviation noted during stress.  No T wave inversion was noted during stress.  The study is normal.  This is a low risk study.  The left ventricular ejection fraction is hyperdynamic (>65%).  Normal pharmacologic nuclear stress test with no evidence of prior infarct or ischemia. Hyperdynamic LVEF.     Labs:   Basic Metabolic Panel:  Recent Labs Lab 02/27/16 1751  NA 141  K 4.1  CL 109  CO2 25  GLUCOSE 83  BUN 18  CREATININE 1.11*  CALCIUM 9.6   GFR Estimated Creatinine Clearance: 40.5 mL/min (by C-G formula based on SCr of 1.11 mg/dL (H)). Liver Function Tests:  Recent Labs Lab 02/27/16 1751  AST 24  ALT 17  ALKPHOS 48  BILITOT 0.5  PROT 6.4*  ALBUMIN 4.0     Recent Labs Lab 02/27/16 1751  LIPASE 34   No results for input(s): AMMONIA in the last 168 hours. Coagulation profile No results for input(s): INR, PROTIME in the last 168 hours.  CBC:  Recent Labs Lab 02/27/16 1751  WBC 10.2  HGB 13.1  HCT 38.6  MCV 95.3  PLT 249   Cardiac Enzymes:  Recent Labs Lab 02/27/16 2129 02/28/16 0001 02/28/16 0233  TROPONINI <0.03 <0.03 <0.03   BNP: Invalid input(s): POCBNP CBG: No results for input(s): GLUCAP in the last 168 hours. D-Dimer  Recent Labs  02/27/16 1751  DDIMER 0.44   Hgb A1c No results for input(s): HGBA1C in the last 72 hours. Lipid Profile  Recent Labs  02/28/16 0800  CHOL 158  HDL 49  LDLCALC 59  TRIG 248*  CHOLHDL 3.2   Thyroid function studies No results for input(s): TSH, T4TOTAL, T3FREE, THYROIDAB in the last 72 hours.  Invalid input(s): FREET3 Anemia work up No results for input(s): VITAMINB12, FOLATE, FERRITIN, TIBC, IRON, RETICCTPCT in the last 72 hours. Microbiology No results found for this or any previous visit (from the past 240 hour(s)).   Discharge Instructions:   Discharge Instructions    Diet - low sodium heart healthy    Complete by:  As directed    Increase activity slowly    Complete by:  As directed        Medication List    TAKE these medications   aspirin EC 81 MG tablet Take 81 mg by mouth daily.   FIBER SELECT GUMMIES PO Take 1 capsule by mouth daily.   Ginkgo Biloba 40 MG Tabs Take 40 mg by mouth daily.   GLUCOSAMINE 1500 COMPLEX PO Take 1 tablet by mouth daily.   lisinopril 10 MG tablet Commonly known as:  PRINIVIL,ZESTRIL Take 10 mg by mouth at bedtime.   multivitamin with minerals Tabs tablet Take 1 tablet by mouth daily.   pantoprazole 40 MG tablet Commonly known as:  PROTONIX Take 40 mg by mouth at bedtime.   simvastatin 20 MG tablet Commonly known as:  ZOCOR Take 20 mg by mouth at bedtime.   traZODone 50 MG tablet Commonly known as:   DESYREL   venlafaxine XR 37.5 MG 24 hr capsule Commonly known as:  EFFEXOR-XR Take 1 capsule by mouth  daily   vitamin E 1000 UNIT capsule Take 1,000 Units by mouth daily.      Follow-up Information    MACKENZIE,BRIAN, MD Follow up in 1 week(s).   Specialty:  Internal Medicine Contact information: [address], [place] [address] [phone]            Time coordinating discharge: 35 min  Signed:  Katrina Brosh Alison Stalling   [facility] 02/28/2016, 4:57 PM

## 2016-02-28 NOTE — Progress Notes (Signed)
Patient presented for Lexiscan. Tolerated procedure well. Pending final stress imaging result.  

## 2016-02-28 NOTE — Progress Notes (Signed)
Echocardiogram 2D Echocardiogram has been performed.  Carolyn Lowe 02/28/2016, 1:40 PM

## 2016-10-19 ENCOUNTER — Other Ambulatory Visit: Payer: Self-pay | Admitting: Oncology

## 2016-12-25 ENCOUNTER — Other Ambulatory Visit: Payer: Self-pay | Admitting: Internal Medicine

## 2016-12-25 DIAGNOSIS — Z1231 Encounter for screening mammogram for malignant neoplasm of breast: Secondary | ICD-10-CM

## 2017-01-05 ENCOUNTER — Ambulatory Visit
Admission: RE | Admit: 2017-01-05 | Discharge: 2017-01-05 | Disposition: A | Discharge status: Home / Self Care | Payer: PPO | Source: Ambulatory Visit | Attending: Internal Medicine | Admitting: Internal Medicine

## 2017-01-05 DIAGNOSIS — Z1231 Encounter for screening mammogram for malignant neoplasm of breast: Secondary | ICD-10-CM | POA: Diagnosis not present

## 2017-01-05 HISTORY — DX: Personal history of irradiation: Z92.3

## 2017-01-09 ENCOUNTER — Ambulatory Visit (INDEPENDENT_AMBULATORY_CARE_PROVIDER_SITE_OTHER): Payer: PPO | Admitting: Obstetrics & Gynecology

## 2017-01-09 ENCOUNTER — Encounter: Payer: Self-pay | Admitting: Obstetrics & Gynecology

## 2017-01-09 ENCOUNTER — Other Ambulatory Visit: Payer: Self-pay | Admitting: Oncology

## 2017-01-09 VITALS — BP 119/79 | HR 84 | Wt 130.4 lb

## 2017-01-09 DIAGNOSIS — E2839 Other primary ovarian failure: Secondary | ICD-10-CM

## 2017-01-09 DIAGNOSIS — Z01419 Encounter for gynecological examination (general) (routine) without abnormal findings: Secondary | ICD-10-CM

## 2017-01-09 DIAGNOSIS — Z90710 Acquired absence of both cervix and uterus: Secondary | ICD-10-CM

## 2017-01-09 NOTE — Patient Instructions (Signed)
Preventive Care 65 Years and Older, Female Preventive care refers to lifestyle choices and visits with your health care provider that can promote health and wellness. What does preventive care include?  A yearly physical exam. This is also called an annual well check.  Dental exams once or twice a year.  Routine eye exams. Ask your health care provider how often you should have your eyes checked.  Personal lifestyle choices, including: ? Daily care of your teeth and gums. ? Regular physical activity. ? Eating a healthy diet. ? Avoiding tobacco and drug use. ? Limiting alcohol use. ? Practicing safe sex. ? Taking low-dose aspirin every day. ? Taking vitamin and mineral supplements as recommended by your health care provider. What happens during an annual well check? The services and screenings done by your health care provider during your annual well check will depend on your age, overall health, lifestyle risk factors, and family history of disease. Counseling Your health care provider may ask you questions about your:  Alcohol use.  Tobacco use.  Drug use.  Emotional well-being.  Home and relationship well-being.  Sexual activity.  Eating habits.  History of falls.  Memory and ability to understand (cognition).  Work and work environment.  Reproductive health.  Screening You may have the following tests or measurements:  Height, weight, and BMI.  Blood pressure.  Lipid and cholesterol levels. These may be checked every 5 years, or more frequently if you are over 50 years old.  Skin check.  Lung cancer screening. You may have this screening every year starting at age 55 if you have a 30-pack-year history of smoking and currently smoke or have quit within the past 15 years.  Fecal occult blood test (FOBT) of the stool. You may have this test every year starting at age 50.  Flexible sigmoidoscopy or colonoscopy. You may have a sigmoidoscopy every 5 years or  a colonoscopy every 10 years starting at age 50.  Hepatitis C blood test.  Hepatitis B blood test.  Sexually transmitted disease (STD) testing.  Diabetes screening. This is done by checking your blood sugar (glucose) after you have not eaten for a while (fasting). You may have this done every 1-3 years.  Bone density scan. This is done to screen for osteoporosis. You may have this done starting at age 65.  Mammogram. This may be done every 1-2 years. Talk to your health care provider about how often you should have regular mammograms.  Talk with your health care provider about your test results, treatment options, and if necessary, the need for more tests. Vaccines Your health care provider may recommend certain vaccines, such as:  Influenza vaccine. This is recommended every year.  Tetanus, diphtheria, and acellular pertussis (Tdap, Td) vaccine. You may need a Td booster every 10 years.  Varicella vaccine. You may need this if you have not been vaccinated.  Zoster vaccine. You may need this after age 60.  Measles, mumps, and rubella (MMR) vaccine. You may need at least one dose of MMR if you were born in 1957 or later. You may also need a second dose.  Pneumococcal 13-valent conjugate (PCV13) vaccine. One dose is recommended after age 65.  Pneumococcal polysaccharide (PPSV23) vaccine. One dose is recommended after age 65.  Meningococcal vaccine. You may need this if you have certain conditions.  Hepatitis A vaccine. You may need this if you have certain conditions or if you travel or work in places where you may be exposed to hepatitis   A.  Hepatitis B vaccine. You may need this if you have certain conditions or if you travel or work in places where you may be exposed to hepatitis B.  Haemophilus influenzae type b (Hib) vaccine. You may need this if you have certain conditions.  Talk to your health care provider about which screenings and vaccines you need and how often you  need them. This information is not intended to replace advice given to you by your health care provider. Make sure you discuss any questions you have with your health care provider. Document Released: 04/02/2015 Document Revised: 11/24/2015 Document Reviewed: 01/05/2015 Elsevier Interactive Patient Education  2017 [employer].

## 2017-01-09 NOTE — Progress Notes (Signed)
GYNECOLOGY ANNUAL PREVENTATIVE CARE ENCOUNTER NOTE  Subjective:   Carolyn Lowe is a 66 y.o. PMP female with history of breast cancer s/p lumpectomy, XRT, tamoxifen and Arimidex therapy (last use medications 2 years ago) here for a routine annual gynecologic exam.  Current complaints: none.   Denies abnormal vaginal bleeding, discharge, pelvic pain, problems with intercourse or other gynecologic concerns.    Gynecologic History No LMP recorded. Patient is postmenopausal. Contraception: status post hysterectomy Last mammogram: 01/05/2017. Results were: normal  Obstetric History OB History  No data available    Past Medical History:  Diagnosis Date  . Arthritis   . Brain aneurysm    Resolved spontaneously- IT SEALED IT SELF - PT WAS IN HER EARLY 30'S   . Breast cancer ([place])   . Colon polyp, hyperplastic   . Depression    DEPRESSION RELATED TO HX BREAST CANCER/ ARIMEDEX SIDE EFFECTS- NO LONGER ON ARIMEDEX - AND DOING BETTER  . GERD (gastroesophageal reflux disease)   . Hypercholesterolemia   . Hypertension   . Personal history of radiation therapy   . Right trigger finger     Past Surgical History:  Procedure Laterality Date  . ABDOMINAL HYSTERECTOMY  1984  . BREAST LUMPECTOMY Left   . BREAST SURGERY  2010   LEFT BREAST LUMPECTOMY AND AXILLARY NODE DISSECTION  . CARPAL TUNNEL RELEASE Right 2013  . CHOLECYSTECTOMY  1999  . INCISIONAL HERNIA REPAIR N/A 10/22/2013   Procedure: LAPAROSCOPIC INCISIONAL HERNIA;  Surgeon: Harl Bowie, MD;  Location: [facility];  Service: General;  Laterality: N/A;  . INSERTION OF MESH N/A 10/22/2013   Procedure: INSERTION OF MESH;  Surgeon: Harl Bowie, MD;  Location: [facility];  Service: General;  Laterality: N/A;  . NASAL SINUS SURGERY  1987  . right knee arthroscopy Right 2005    Current Outpatient Prescriptions on File Prior to Visit  Medication Sig Dispense Refill  . aspirin EC 81 MG tablet Take 81 mg by mouth daily.    Marland Kitchen  FIBER SELECT GUMMIES PO Take 1 capsule by mouth daily.     . Ginkgo Biloba 40 MG TABS Take 40 mg by mouth daily.     . Glucosamine-Chondroit-Vit C-Mn (GLUCOSAMINE 1500 COMPLEX PO) Take 1 tablet by mouth daily.     Marland Kitchen lisinopril (PRINIVIL,ZESTRIL) 10 MG tablet Take 10 mg by mouth at bedtime.     . Multiple Vitamin (MULTIVITAMIN WITH MINERALS) TABS tablet Take 1 tablet by mouth daily.    . pantoprazole (PROTONIX) 40 MG tablet Take 40 mg by mouth at bedtime.     . simvastatin (ZOCOR) 20 MG tablet Take 40 mg by mouth at bedtime.     . traZODone (DESYREL) 50 MG tablet     . vitamin E 1000 UNIT capsule Take 1,000 Units by mouth daily.     No current facility-administered medications on file prior to visit.     No Known Allergies  Social History   Social History  . Marital status: Married    Spouse name: N/A  . Number of children: N/A  . Years of education: N/A   Occupational History  . Not on file.   Social History Main Topics  . Smoking status: Never Smoker  . Smokeless tobacco: Never Used  . Alcohol use No  . Drug use: No  . Sexual activity: Not on file   Other Topics Concern  . Not on file   Social History Narrative  . No narrative on  file    Family History  Problem Relation Age of Onset  . Heart attack Mother 97  . Atrial fibrillation Mother   . Heart disease Father        CABG 4V, two stents since  . Heart disease Paternal Grandfather   . Breast cancer Neg Hx     The following portions of the patient's history were reviewed and updated as appropriate: allergies, current medications, past family history, past medical history, past social history, past surgical history and problem list.  Review of Systems Pertinent items noted in HPI and remainder of comprehensive ROS otherwise negative.   Objective:  BP 119/79   Pulse 84   Wt 130 lb 6.4 oz (59.1 kg)   BMI 23.85 kg/m  CONSTITUTIONAL: Well-developed, well-nourished female in no acute distress.  HENT:   Normocephalic, atraumatic, External right and left ear normal. Oropharynx is clear and moist EYES: Conjunctivae and EOM are normal. Pupils are equal, round, and reactive to light. No scleral icterus.  NECK: Normal range of motion, supple, no masses.  Normal thyroid.  SKIN: Skin is warm and dry. No rash noted. Not diaphoretic. No erythema. No pallor. NEUROLOGIC: Alert and oriented to person, place, and time. Normal reflexes, muscle tone coordination. No cranial nerve deficit noted. PSYCHIATRIC: Normal mood and affect. Normal behavior. Normal judgment and thought content. CARDIOVASCULAR: Normal heart rate noted, regular rhythm RESPIRATORY: Clear to auscultation bilaterally. Effort and breath sounds normal, no problems with respiration noted. BREASTS: Symmetric in size. No masses, skin changes, nipple drainage, or lymphadenopathy. Healed left lumpectomy scar.  ABDOMEN: Soft, normal bowel sounds, no distention noted.  No tenderness, rebound or guarding.  PELVIC: Normal appearing external genitalia; normal appearing vaginal mucosa and cuff with mild atrophy.  No abnormal discharge noted.  No adnexal tenderness. MUSCULOSKELETAL: Normal range of motion. No tenderness.  No cyanosis, clubbing, or edema.  2+ distal pulses.  Mm Screening Breast Tomo Bilateral  Result Date: 01/05/2017 CLINICAL DATA:  Screening. EXAM: 2D DIGITAL SCREENING BILATERAL MAMMOGRAM WITH CAD AND ADJUNCT TOMO COMPARISON:  Previous exam(s). ACR Breast Density Category b: There are scattered areas of fibroglandular density. FINDINGS: There are no findings suspicious for malignancy. Images were processed with CAD. IMPRESSION: No mammographic evidence of malignancy. A result letter of this screening mammogram will be mailed directly to the patient. RECOMMENDATION: Screening mammogram in one year. (Code:SM-B-01Y) BI-RADS CATEGORY  1: Negative. Electronically Signed   By: Ammie Ferrier M.D.   On: 01/05/2017 15:39    Assessment and Plan:    1. At risk for bone density loss Bone density scan ordered - DG Bone Density; Future  2. History of hysterectomy for benign indications 3. Encounter for gynecological examination  No vaginal pap indicated. Normal pelvic exam.  Routine preventative health maintenance measures emphasized. Please refer to After Visit Summary for other counseling recommendations.    Verita Schneiders, MD, [place] Attending Obstetrician & Gynecologist, [place] for [facility]

## 2017-01-17 DIAGNOSIS — F0390 Unspecified dementia without behavioral disturbance: Secondary | ICD-10-CM | POA: Diagnosis not present

## 2017-01-17 DIAGNOSIS — K219 Gastro-esophageal reflux disease without esophagitis: Secondary | ICD-10-CM | POA: Diagnosis not present

## 2017-01-17 DIAGNOSIS — I10 Essential (primary) hypertension: Secondary | ICD-10-CM | POA: Diagnosis not present

## 2017-01-17 DIAGNOSIS — F329 Major depressive disorder, single episode, unspecified: Secondary | ICD-10-CM | POA: Diagnosis not present

## 2017-01-17 DIAGNOSIS — E7800 Pure hypercholesterolemia, unspecified: Secondary | ICD-10-CM | POA: Diagnosis not present

## 2017-01-31 ENCOUNTER — Ambulatory Visit
Admission: RE | Admit: 2017-01-31 | Discharge: 2017-01-31 | Disposition: A | Discharge status: Home / Self Care | Payer: PPO | Source: Ambulatory Visit | Attending: Obstetrics & Gynecology | Admitting: Obstetrics & Gynecology

## 2017-01-31 DIAGNOSIS — Z780 Asymptomatic menopausal state: Secondary | ICD-10-CM | POA: Diagnosis not present

## 2017-01-31 DIAGNOSIS — M8588 Other specified disorders of bone density and structure, other site: Secondary | ICD-10-CM | POA: Diagnosis not present

## 2017-01-31 DIAGNOSIS — E2839 Other primary ovarian failure: Secondary | ICD-10-CM

## 2017-02-01 ENCOUNTER — Encounter: Payer: Self-pay | Admitting: Obstetrics & Gynecology

## 2017-03-29 DIAGNOSIS — E538 Deficiency of other specified B group vitamins: Secondary | ICD-10-CM | POA: Diagnosis not present

## 2017-03-29 DIAGNOSIS — Z23 Encounter for immunization: Secondary | ICD-10-CM | POA: Diagnosis not present

## 2017-03-29 DIAGNOSIS — I129 Hypertensive chronic kidney disease with stage 1 through stage 4 chronic kidney disease, or unspecified chronic kidney disease: Secondary | ICD-10-CM | POA: Diagnosis not present

## 2017-03-29 DIAGNOSIS — Z0000 Encounter for general adult medical examination without abnormal findings: Secondary | ICD-10-CM | POA: Diagnosis not present

## 2017-03-29 DIAGNOSIS — E785 Hyperlipidemia, unspecified: Secondary | ICD-10-CM | POA: Diagnosis not present

## 2017-04-10 DIAGNOSIS — E538 Deficiency of other specified B group vitamins: Secondary | ICD-10-CM | POA: Diagnosis not present

## 2017-04-10 DIAGNOSIS — E785 Hyperlipidemia, unspecified: Secondary | ICD-10-CM | POA: Diagnosis not present

## 2017-04-10 DIAGNOSIS — K219 Gastro-esophageal reflux disease without esophagitis: Secondary | ICD-10-CM | POA: Diagnosis not present

## 2017-04-10 DIAGNOSIS — I129 Hypertensive chronic kidney disease with stage 1 through stage 4 chronic kidney disease, or unspecified chronic kidney disease: Secondary | ICD-10-CM | POA: Diagnosis not present

## 2017-04-10 DIAGNOSIS — F339 Major depressive disorder, recurrent, unspecified: Secondary | ICD-10-CM | POA: Diagnosis not present

## 2017-04-10 DIAGNOSIS — Z853 Personal history of malignant neoplasm of breast: Secondary | ICD-10-CM | POA: Diagnosis not present

## 2017-04-10 DIAGNOSIS — N183 Chronic kidney disease, stage 3 (moderate): Secondary | ICD-10-CM | POA: Diagnosis not present

## 2017-04-10 DIAGNOSIS — M8580 Other specified disorders of bone density and structure, unspecified site: Secondary | ICD-10-CM | POA: Diagnosis not present

## 2017-04-10 DIAGNOSIS — Z0000 Encounter for general adult medical examination without abnormal findings: Secondary | ICD-10-CM | POA: Diagnosis not present

## 2017-05-23 NOTE — Progress Notes (Signed)
This encounter was created in error - please disregard.

## 2017-06-04 ENCOUNTER — Encounter: Payer: Self-pay | Admitting: Podiatry

## 2017-06-04 ENCOUNTER — Ambulatory Visit (INDEPENDENT_AMBULATORY_CARE_PROVIDER_SITE_OTHER): Payer: PPO

## 2017-06-04 ENCOUNTER — Ambulatory Visit: Payer: 59 | Admitting: Podiatry

## 2017-06-04 DIAGNOSIS — M779 Enthesopathy, unspecified: Principal | ICD-10-CM

## 2017-06-04 DIAGNOSIS — M778 Other enthesopathies, not elsewhere classified: Secondary | ICD-10-CM

## 2017-06-04 DIAGNOSIS — M898X9 Other specified disorders of bone, unspecified site: Secondary | ICD-10-CM | POA: Diagnosis not present

## 2017-06-04 DIAGNOSIS — M7751 Other enthesopathy of right foot: Secondary | ICD-10-CM | POA: Diagnosis not present

## 2017-06-04 DIAGNOSIS — M7750 Other enthesopathy of unspecified foot: Secondary | ICD-10-CM

## 2017-06-04 NOTE — Progress Notes (Signed)
Subjective:  Patient ID: Carolyn Lowe, female    DOB: [birth year],  MRN: [id number] HPI Chief Complaint  Patient presents with  . Foot Pain    Patient presents today for right foot pain x 2 months.  She reports a sharp stabbing pain from toes across top of foot, and stabbing pains all over foot at night.  She has been using biofreeze, Ibuprofen and ice with some relief  . New Patient (Initial Visit)    67 y.o. female presents with the above complaint.   She denies fever chills nausea vomiting muscle aches pains shortness of breath chest pain or headache.  Past Medical History:  Diagnosis Date  . Arthritis   . Brain aneurysm    Resolved spontaneously- IT SEALED IT SELF - PT WAS IN HER EARLY 30'S   . Breast cancer ([place])   . Colon polyp, hyperplastic   . Depression    DEPRESSION RELATED TO HX BREAST CANCER/ ARIMEDEX SIDE EFFECTS- NO LONGER ON ARIMEDEX - AND DOING BETTER  . GERD (gastroesophageal reflux disease)   . Hypercholesterolemia   . Hypertension   . Personal history of radiation therapy   . Right trigger finger    Past Surgical History:  Procedure Laterality Date  . ABDOMINAL HYSTERECTOMY  1984  . BREAST LUMPECTOMY Left   . BREAST SURGERY  2010   LEFT BREAST LUMPECTOMY AND AXILLARY NODE DISSECTION  . CARPAL TUNNEL RELEASE Right 2013  . CHOLECYSTECTOMY  1999  . INCISIONAL HERNIA REPAIR N/A 10/22/2013   Procedure: LAPAROSCOPIC INCISIONAL HERNIA;  Surgeon: Harl Bowie, MD;  [facility];  Service: General;  Laterality: N/A;  . INSERTION OF MESH N/A 10/22/2013   Procedure: INSERTION OF MESH;  Surgeon: Harl Bowie, MD;  [facility];  Service: General;  Laterality: N/A;  . NASAL SINUS SURGERY  1987  . right knee arthroscopy Right 2005    Current Outpatient Medications:  .  aspirin EC 81 MG tablet, Take 81 mg by mouth daily., Disp: , Rfl:  .  FIBER SELECT GUMMIES PO, Take 1 capsule by mouth daily. , Disp: , Rfl:  .  Ginkgo Biloba 40 MG TABS, Take  40 mg by mouth daily. , Disp: , Rfl:  .  Glucosamine-Chondroit-Vit C-Mn (GLUCOSAMINE 1500 COMPLEX PO), Take 1 tablet by mouth daily. , Disp: , Rfl:  .  hydrochlorothiazide (HYDRODIURIL) 12.5 MG tablet, , Disp: , Rfl:  .  lisinopril (PRINIVIL,ZESTRIL) 30 MG tablet, , Disp: , Rfl:  .  Multiple Vitamin (MULTIVITAMIN WITH MINERALS) TABS tablet, Take 1 tablet by mouth daily., Disp: , Rfl:  .  pantoprazole (PROTONIX) 40 MG tablet, Take 40 mg by mouth at bedtime. , Disp: , Rfl:  .  simvastatin (ZOCOR) 20 MG tablet, Take 40 mg by mouth at bedtime. , Disp: , Rfl:  .  venlafaxine XR (EFFEXOR-XR) 37.5 MG 24 hr capsule, TAKE 1 CAPSULE BY MOUTH  DAILY, Disp: 90 capsule, Rfl: 0 .  vitamin E 1000 UNIT capsule, Take 1,000 Units by mouth daily., Disp: , Rfl:   No Known Allergies Review of Systems Objective:  There were no vitals filed for this visit.  General: Well developed, nourished, in no acute distress, alert and oriented x3   Dermatological: Skin is warm, dry and supple bilateral. Nails x 10 are well maintained; remaining integument appears unremarkable at this time. There are no open sores, no preulcerative lesions, no rash or signs of infection present.  Vascular: Dorsalis Pedis artery and Posterior Tibial artery  pedal pulses are 2/4 bilateral with immedate capillary fill time. Pedal hair growth present. No varicosities and no lower extremity edema present bilateral.   Neruologic: Grossly intact via light touch bilateral. Vibratory intact via tuning fork bilateral. Protective threshold with Semmes Wienstein monofilament intact to all pedal sites bilateral. Patellar and Achilles deep tendon reflexes 2+ bilateral. No Babinski or clonus noted bilateral.   Musculoskeletal: No gross boney pedal deformities bilateral. No pain, crepitus, or limitation noted with foot and ankle range of motion bilateral. Muscular strength 5/5 in all groups tested bilateral.  She has pain on the dorsal aspect of the right foot  at the tarsometatarsal junction.  She has moderate to severe pain on palpation of the deep peroneal nerve.  Frontal plane range of motion is painful in the same area both on inversion and eversion.  Gait: Unassisted, Nonantalgic.    Radiographs:  3 views of the right foot demonstrates an osseously mature foot with no acute findings.  Chronic findings consistent with osteoarthritis at the second tarsometatarsal articulation.  Mild dorsal spurring is noted.  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  Plan: Discussed etiology pathology conservative versus surgical therapies.  After verbal consent and thorough discussion we injected the dorsal aspect of her right foot with 20 mg Kenalog 5 mg Marcaine after sterile Betadine skin prep and verbal consent.  She tolerated procedure well without complications.  We discussed appropriate shoe gear stretching exercises ice therapy as your modifications and the possible need for orthoses in the future.     Carolyn Lowe. [place], [place]

## 2017-07-16 ENCOUNTER — Ambulatory Visit: Payer: PPO | Admitting: Podiatry

## 2017-10-02 DIAGNOSIS — I129 Hypertensive chronic kidney disease with stage 1 through stage 4 chronic kidney disease, or unspecified chronic kidney disease: Secondary | ICD-10-CM | POA: Diagnosis not present

## 2017-10-02 DIAGNOSIS — E785 Hyperlipidemia, unspecified: Secondary | ICD-10-CM | POA: Diagnosis not present

## 2017-10-09 DIAGNOSIS — E785 Hyperlipidemia, unspecified: Secondary | ICD-10-CM | POA: Diagnosis not present

## 2017-10-09 DIAGNOSIS — K219 Gastro-esophageal reflux disease without esophagitis: Secondary | ICD-10-CM | POA: Diagnosis not present

## 2017-10-09 DIAGNOSIS — I129 Hypertensive chronic kidney disease with stage 1 through stage 4 chronic kidney disease, or unspecified chronic kidney disease: Secondary | ICD-10-CM | POA: Diagnosis not present

## 2017-10-09 DIAGNOSIS — M8580 Other specified disorders of bone density and structure, unspecified site: Secondary | ICD-10-CM | POA: Diagnosis not present

## 2017-12-21 ENCOUNTER — Telehealth: Payer: Self-pay | Admitting: *Deleted

## 2017-12-21 ENCOUNTER — Other Ambulatory Visit: Payer: Self-pay | Admitting: *Deleted

## 2017-12-21 DIAGNOSIS — C50912 Malignant neoplasm of unspecified site of left female breast: Secondary | ICD-10-CM

## 2017-12-21 DIAGNOSIS — Z23 Encounter for immunization: Secondary | ICD-10-CM | POA: Diagnosis not present

## 2017-12-21 NOTE — Telephone Encounter (Signed)
This RN spoke with pt per her call stating concern and request for MD visit due to new occurrence of left breast pain.  Per inquiry pain is in the left breast near the sight of pt's prior lumpectomy.  Pt states pain is present at rest as well as well palpitation.  She denies any swelling, lumps or redness.  Per discussion - she thinks she is not due for her mammogram until December.  Per MD review recommendation given for pt to proceed with mammogram now ( bilateral if able ) and dedicated ultrasound of the left breast scheduled MD follow up post scan.  This RN reviewed chart and noted pt's last mammogram was in October of 2018.  Bilateral mammogram ordered with [place].  Above discussed and pt in agreement with plan.

## 2017-12-24 ENCOUNTER — Telehealth: Payer: Self-pay | Admitting: Oncology

## 2017-12-24 NOTE — Telephone Encounter (Signed)
Called regarding sch msg patient will be out of town she requested particular date

## 2017-12-31 ENCOUNTER — Ambulatory Visit: Payer: PPO

## 2017-12-31 ENCOUNTER — Ambulatory Visit
Admission: RE | Admit: 2017-12-31 | Discharge: 2017-12-31 | Disposition: A | Discharge status: Home / Self Care | Payer: PPO | Source: Ambulatory Visit | Attending: Oncology | Admitting: Oncology

## 2017-12-31 DIAGNOSIS — N853 Subinvolution of uterus: Secondary | ICD-10-CM | POA: Diagnosis not present

## 2017-12-31 DIAGNOSIS — C50912 Malignant neoplasm of unspecified site of left female breast: Secondary | ICD-10-CM

## 2017-12-31 DIAGNOSIS — R928 Other abnormal and inconclusive findings on diagnostic imaging of breast: Secondary | ICD-10-CM | POA: Diagnosis not present

## 2018-01-03 ENCOUNTER — Ambulatory Visit: Payer: PPO | Admitting: Adult Health

## 2018-01-14 ENCOUNTER — Telehealth: Payer: Self-pay | Admitting: Adult Health

## 2018-01-14 ENCOUNTER — Inpatient Hospital Stay: Payer: PPO | Attending: Adult Health | Admitting: Adult Health

## 2018-01-14 ENCOUNTER — Encounter: Payer: Self-pay | Admitting: Adult Health

## 2018-01-14 VITALS — BP 145/84 | HR 64 | Temp 98.1°F | Resp 18 | Ht 62.0 in | Wt 138.1 lb

## 2018-01-14 DIAGNOSIS — E7800 Pure hypercholesterolemia, unspecified: Secondary | ICD-10-CM | POA: Insufficient documentation

## 2018-01-14 DIAGNOSIS — M1990 Unspecified osteoarthritis, unspecified site: Secondary | ICD-10-CM | POA: Diagnosis not present

## 2018-01-14 DIAGNOSIS — Z79899 Other long term (current) drug therapy: Secondary | ICD-10-CM | POA: Insufficient documentation

## 2018-01-14 DIAGNOSIS — Z7982 Long term (current) use of aspirin: Secondary | ICD-10-CM | POA: Insufficient documentation

## 2018-01-14 DIAGNOSIS — Z170 Estrogen receptor positive status [ER+]: Secondary | ICD-10-CM | POA: Insufficient documentation

## 2018-01-14 DIAGNOSIS — Z853 Personal history of malignant neoplasm of breast: Secondary | ICD-10-CM | POA: Diagnosis not present

## 2018-01-14 DIAGNOSIS — M8580 Other specified disorders of bone density and structure, unspecified site: Secondary | ICD-10-CM | POA: Diagnosis not present

## 2018-01-14 DIAGNOSIS — Z923 Personal history of irradiation: Secondary | ICD-10-CM | POA: Diagnosis not present

## 2018-01-14 DIAGNOSIS — I10 Essential (primary) hypertension: Secondary | ICD-10-CM | POA: Insufficient documentation

## 2018-01-14 DIAGNOSIS — F329 Major depressive disorder, single episode, unspecified: Secondary | ICD-10-CM | POA: Insufficient documentation

## 2018-01-14 DIAGNOSIS — C50912 Malignant neoplasm of unspecified site of left female breast: Secondary | ICD-10-CM

## 2018-01-14 DIAGNOSIS — K219 Gastro-esophageal reflux disease without esophagitis: Secondary | ICD-10-CM | POA: Insufficient documentation

## 2018-01-14 DIAGNOSIS — N644 Mastodynia: Secondary | ICD-10-CM | POA: Diagnosis not present

## 2018-01-14 DIAGNOSIS — Z9223 Personal history of estrogen therapy: Secondary | ICD-10-CM | POA: Diagnosis not present

## 2018-01-14 NOTE — Progress Notes (Signed)
ID: Carolyn Lowe   DOB: April 20, 1950  MR#: [id number]  IOE#:[id number]  PCP: Jani Gravel, MD GYN: Brien Few, MD SU:  OTHER MD: [place] [phone])  CHIEF COMPLAINT:  Left Breast Cancer  CURRENT TREATMENT: Tamoxifen   HISTORY OF PRESENT ILLNESS: From the original intake note:  The patient had routine mammography [facility] 11/03/2008 which showed a possible mass in the left breast. Additional views confirmed a spiculated mass with irregular borders which was taller than wide and ultrasound-guided biopsy was performed 11/17/2008. The pathology showed a 6 mm invasive ductal carcinoma, grade 1, estrogen and progesterone receptor positive, HER-2 negative. She had her definitive left lumpectomy 01/04/2009, showing no additional tumor in the breast and 0 of 2 sentinel lymph nodes involved. She received adjuvant radiation through December of 2010 at which time she started anastrozole.   Her subsequent history is as detailed below.  INTERVAL HISTORY: Carolyn Lowe returns today for an urgent visit regarding left breast and left axillary pain.  This pain started about 2 months ago.  She notes that she felt that it was hypersensitive when she was coming down with a cold.  She noted it improved when her cold resolved, however it has returned and will not improve.    The pain is an ache for the most part, but occasionally will feel a sharp sensation.  It is tender to the touch.  She cannot find any lumps or bumps that she knows of, but notes that she has a hard time telling with her breast composition.  She denies any recent fevers, chills.  She did undergo left breast mammogram on 10/14 that did not note any changes.  No ultrasound was ordered at that time. No redness or swelling to the area.    REVIEW OF SYSTEMS: Carolyn Lowe is doing well otherwise.  She is without any unusual headaches or vision changes.  She denies any nausea, vomiting, constipation, diarrhea, or any appetite  loss/dysphagia.  She isn't having any chest pain, palpitations, cough, or shortness of breath.  A detailed ROS was conducted and was otherwise non contributory today.  PAST MEDICAL HISTORY: Past Medical History:  Diagnosis Date  . Arthritis   . Brain aneurysm    Resolved spontaneously- IT SEALED IT SELF - PT WAS IN HER EARLY 30'S   . Breast cancer ([place])   . Colon polyp, hyperplastic   . Depression    DEPRESSION RELATED TO HX BREAST CANCER/ ARIMEDEX SIDE EFFECTS- NO LONGER ON ARIMEDEX - AND DOING BETTER  . GERD (gastroesophageal reflux disease)   . Hypercholesterolemia   . Hypertension   . Personal history of radiation therapy   . Right trigger finger     PAST SURGICAL HISTORY: Past Surgical History:  Procedure Laterality Date  . ABDOMINAL HYSTERECTOMY  1984  . BREAST LUMPECTOMY Left 2010  . BREAST SURGERY  2010   LEFT BREAST LUMPECTOMY AND AXILLARY NODE DISSECTION  . CARPAL TUNNEL RELEASE Right 2013  . CHOLECYSTECTOMY  1999  . INCISIONAL HERNIA REPAIR N/A 10/22/2013   Procedure: LAPAROSCOPIC INCISIONAL HERNIA;  Surgeon: Harl Bowie, MD;  [facility];  Service: General;  Laterality: N/A;  . INSERTION OF MESH N/A 10/22/2013   Procedure: INSERTION OF MESH;  Surgeon: Harl Bowie, MD;  [facility];  Service: General;  Laterality: N/A;  . NASAL SINUS SURGERY  1987  . right knee arthroscopy Right 2005     FAMILY HISTORY Family History  Problem Relation Age of Onset  .  Heart attack Mother 87  . Atrial fibrillation Mother   . Heart disease Father        CABG 4V, two stents since  . Heart disease Paternal Grandfather   . Breast cancer Neg Hx    The patient's parents are still living, both in their early 57s. The patient had 2 brothers, no sister. There is no history of breast or ovarian cancer in the family  GYNECOLOGIC HISTORY: Menarche age 12, first live birth age 63, status post hysterectomy in 1984, on hormone replacement from that time to  2010.  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 Her child from her first marriage, [name], is a Marine scientist in our maternity hospital. The patient's second marriage resulted in no children. She has been married 13 years to [name], who works as a Audiological scientist for the [employer].The patient has one granddaughter and one grandson. She is not a Scientist, research (life sciences). Her autistic brother is currently living with her.  ADVANCED DIRECTIVES: Not in place.  HEALTH MAINTENANCE: (Updated November 2014) Social History   Tobacco Use  . Smoking status: Never Smoker  . Smokeless tobacco: Never Used  Substance Use Topics  . Alcohol use: No  . Drug use: No     Colonoscopy: February of 2013; repeat in 2018    recommended  PAP:  s/p hysterectomy/Pelvic exam UTD, Dr. Ronita Hipps  Bone density: Normal bone density August 2010, severe    osteopenia with a T score of -2.2 in the    spine with repeat bone density May 2013  Lipid panel:  Dr. Alyson Ingles   No Known Allergies  Current Outpatient Medications  Medication Sig Dispense Refill  . aspirin EC 81 MG tablet Take 81 mg by mouth daily.    Marland Kitchen FIBER SELECT GUMMIES PO Take 1 capsule by mouth daily.     . Ginkgo Biloba 40 MG TABS Take 40 mg by mouth daily.     . Glucosamine-Chondroit-Vit C-Mn (GLUCOSAMINE 1500 COMPLEX PO) Take 1 tablet by mouth daily.     . hydrochlorothiazide (HYDRODIURIL) 12.5 MG tablet     . lisinopril (PRINIVIL,ZESTRIL) 30 MG tablet     . Multiple Vitamin (MULTIVITAMIN WITH MINERALS) TABS tablet Take 1 tablet by mouth daily.    . pantoprazole (PROTONIX) 40 MG tablet Take 40 mg by mouth at bedtime.     . simvastatin (ZOCOR) 20 MG tablet Take 40 mg by mouth at bedtime.     Marland Kitchen venlafaxine XR (EFFEXOR-XR) 37.5 MG 24 hr capsule TAKE 1 CAPSULE BY MOUTH  DAILY 90 capsule 0  . vitamin E 1000 UNIT capsule Take 1,000 Units by mouth daily.     No current facility-administered  medications for this visit.     OBJECTIVE: Middle-aged white woman who appears her stated age 67:   01/14/18 1045  BP: (!) 145/84  Lowe: 64  Resp: 18  Temp: 98.1 F (36.7 C)  SpO2: 100%     Body mass index is 25.26 kg/m.    ECOG FS: 0 Filed Weights   01/14/18 1045  Weight: 138 lb 1.6 oz (62.6 kg)  GENERAL: Patient is a well appearing female in no acute distress HEENT:  Sclerae anicteric.  Oropharynx clear and moist. No ulcerations or evidence of oropharyngeal candidiasis. Neck is supple.  NODES:  No cervical, supraclavicular, or axillary lymphadenopathy palpated.  BREAST EXAM:  Right breast benign.  Left breast s/p lumpectomy with mild  amt of scar tissue present in left upper outer quadrant toward left lateral breast/axillary tail she has a slight thickening in tissue.  Slight tenderness noted on palpation, no well circumscribed nodule, redness, swelling noted LUNGS:  Clear to auscultation bilaterally.  No wheezes or rhonchi. HEART:  Regular rate and rhythm. No murmur appreciated. ABDOMEN:  Soft, nontender.  Positive, normoactive bowel sounds. No organomegaly palpated. MSK:  No focal spinal tenderness to palpation. Full range of motion bilaterally in the upper extremities. EXTREMITIES:  No peripheral edema.   SKIN:  Clear with no obvious rashes or skin changes. No nail dyscrasia. NEURO:  Nonfocal. Well oriented.  Appropriate affect.    LAB RESULTS: Lab Results  Component Value Date   WBC 10.2 02/27/2016   NEUTROABS 4.0 01/07/2014   HGB 13.1 02/27/2016   HCT 38.6 02/27/2016   MCV 95.3 02/27/2016   PLT 249 02/27/2016      Chemistry      Component Value Date/Time   NA 141 02/27/2016 1751   NA 140 01/07/2014 0859   K 4.1 02/27/2016 1751   K 4.2 01/07/2014 0859   CL 109 02/27/2016 1751   CL 109 (H) 05/27/2013 0150   CL 103 03/26/2012 1608   CO2 25 02/27/2016 1751   CO2 20 (L) 01/07/2014 0859   BUN 18 02/27/2016 1751   BUN 20.0 01/07/2014 0859   CREATININE 1.11  (H) 02/27/2016 1751   CREATININE 1.0 01/07/2014 0859      Component Value Date/Time   CALCIUM 9.6 02/27/2016 1751   CALCIUM 9.3 01/07/2014 0859   ALKPHOS 48 02/27/2016 1751   ALKPHOS 43 01/07/2014 0859   AST 24 02/27/2016 1751   AST 25 01/07/2014 0859   ALT 17 02/27/2016 1751   ALT 32 01/07/2014 0859   BILITOT 0.5 02/27/2016 1751   BILITOT 0.20 01/07/2014 0859       STUDIES:     ASSESSMENT: 67 y.o. [place] woman originally from [place]   (1)  status post left lumpectomy and sentinel lymph node sampling October 2010 for a pT1b pN0, stage IA invasive ductal carcinoma, grade 1, estrogen and progesterone receptor positive, HER-2 not amplified  (2) completed adjuvant radiation December 2010  (3) on anastrozole December 2010 to January 2014  (4) started tamoxifen January 2014, completed November 2015.   (5) osteopenia, with a T score dropping from normal August 2010 to -2.2 (spine) May 2013  PLAN:  Carolyn Lowe continues to have breast issues.  I reviewed her diagnostic mammogram which didn't show anything.  She is going to go ahead and proceed with bilateral diagnostic mammogram as it is due anytime.  I have added a left breast ultrasound for further evaluation.  She may need MRI in future.  Carolyn Lowe understands the above and is in agreement with it.  She knows to call for any questions or concerns in the interim.    A total of (20) minutes of face-to-face time was spent with this patient with greater than 50% of that time in counseling and care-coordination.   Scot Dock, NP    01/14/2018

## 2018-01-14 NOTE — Telephone Encounter (Signed)
No 10/28 los orders. [facility] will call patient with appt.

## 2018-03-27 ENCOUNTER — Other Ambulatory Visit: Payer: Self-pay | Admitting: Oncology

## 2018-03-27 DIAGNOSIS — Z1231 Encounter for screening mammogram for malignant neoplasm of breast: Secondary | ICD-10-CM

## 2018-03-29 ENCOUNTER — Ambulatory Visit
Admission: RE | Admit: 2018-03-29 | Discharge: 2018-03-29 | Disposition: A | Discharge status: Home / Self Care | Payer: PPO | Source: Ambulatory Visit | Attending: Oncology | Admitting: Oncology

## 2018-03-29 DIAGNOSIS — Z1231 Encounter for screening mammogram for malignant neoplasm of breast: Secondary | ICD-10-CM | POA: Diagnosis not present

## 2018-04-04 DIAGNOSIS — I10 Essential (primary) hypertension: Secondary | ICD-10-CM | POA: Diagnosis not present

## 2018-04-04 DIAGNOSIS — Z0000 Encounter for general adult medical examination without abnormal findings: Secondary | ICD-10-CM | POA: Diagnosis not present

## 2018-04-11 DIAGNOSIS — E785 Hyperlipidemia, unspecified: Secondary | ICD-10-CM | POA: Diagnosis not present

## 2018-04-11 DIAGNOSIS — I10 Essential (primary) hypertension: Secondary | ICD-10-CM | POA: Diagnosis not present

## 2018-04-11 DIAGNOSIS — M8580 Other specified disorders of bone density and structure, unspecified site: Secondary | ICD-10-CM | POA: Diagnosis not present

## 2018-04-11 DIAGNOSIS — F339 Major depressive disorder, recurrent, unspecified: Secondary | ICD-10-CM | POA: Diagnosis not present

## 2018-04-11 DIAGNOSIS — G3184 Mild cognitive impairment, so stated: Secondary | ICD-10-CM | POA: Diagnosis not present

## 2018-04-11 DIAGNOSIS — G4700 Insomnia, unspecified: Secondary | ICD-10-CM | POA: Diagnosis not present

## 2018-04-11 DIAGNOSIS — Z23 Encounter for immunization: Secondary | ICD-10-CM | POA: Diagnosis not present

## 2018-04-11 DIAGNOSIS — E538 Deficiency of other specified B group vitamins: Secondary | ICD-10-CM | POA: Diagnosis not present

## 2018-04-11 DIAGNOSIS — K219 Gastro-esophageal reflux disease without esophagitis: Secondary | ICD-10-CM | POA: Diagnosis not present

## 2018-04-11 DIAGNOSIS — N183 Chronic kidney disease, stage 3 (moderate): Secondary | ICD-10-CM | POA: Diagnosis not present

## 2018-04-11 DIAGNOSIS — Z0000 Encounter for general adult medical examination without abnormal findings: Secondary | ICD-10-CM | POA: Diagnosis not present

## 2018-04-16 ENCOUNTER — Ambulatory Visit: Payer: PPO | Admitting: Family Medicine

## 2018-04-16 ENCOUNTER — Ambulatory Visit: Payer: Self-pay

## 2018-04-16 ENCOUNTER — Encounter: Payer: Self-pay | Admitting: Family Medicine

## 2018-04-16 VITALS — BP 134/94 | HR 71 | Ht 62.0 in | Wt 143.0 lb

## 2018-04-16 DIAGNOSIS — M25562 Pain in left knee: Secondary | ICD-10-CM | POA: Diagnosis not present

## 2018-04-16 DIAGNOSIS — M19079 Primary osteoarthritis, unspecified ankle and foot: Secondary | ICD-10-CM | POA: Diagnosis not present

## 2018-04-16 DIAGNOSIS — M25561 Pain in right knee: Secondary | ICD-10-CM | POA: Diagnosis not present

## 2018-04-16 DIAGNOSIS — M1710 Unilateral primary osteoarthritis, unspecified knee: Secondary | ICD-10-CM

## 2018-04-16 DIAGNOSIS — G8929 Other chronic pain: Secondary | ICD-10-CM | POA: Diagnosis not present

## 2018-04-16 MED ORDER — VITAMIN D (ERGOCALCIFEROL) 1.25 MG (50000 UNIT) PO CAPS: 50000.0000 [IU] | ORAL_CAPSULE | ORAL | 0 refills | Status: DC

## 2018-04-16 NOTE — Assessment & Plan Note (Addendum)
Midfoot arthritis.  Discussed shoes and rocker-bottom shoes discussed with patient and icing regimen, topical anti-inflammatories, which activities to do which wants to avoid.  Patient is to increase activity slowly over the course the next several days.  Follow-up again in 4 to 6 weeks

## 2018-04-16 NOTE — Assessment & Plan Note (Signed)
Patellofemoral arthritis.  Discussed with patient in great length.  Discussed icing regimen and home exercise.  Discussed which activities to do which wants to avoid.  Declined any type of injection today.  Tru pull lite brace given today.  Topical anti-inflammatories discussed.  Patient will follow-up with me again in 4 weeks

## 2018-04-16 NOTE — Progress Notes (Signed)
[facility] [place], [place] [address] Phone: [phone] Subjective:    I Carolyn Lowe am serving as a Education administrator for Dr. Hulan Saas.    CC: Foot pain, bilateral knee pain  [id number]  Carolyn Lowe is a 68 y.o. female coming in with complaint of bilateral knee pain. Bilateral knee surgery. Right sided foot pain. Mid foot is "bigger" on the right side compared to the left. If she wraps her knee then her ankle swells. Right hip pain when the knee is at its worse. Has felt the pain in her right shoulder as well.   Onset- Chronic Location- lateral right and patellar tendon  Character- sharp Aggravating factors- stairs (going down is the worse) Reliving factors-ice Therapies tried- Ice, heat, aleve, topical  Severity-5-10.  Tries to continue to increase activity now.     Past Medical History:  Diagnosis Date  . Arthritis   . Brain aneurysm    Resolved spontaneously- IT SEALED IT SELF - PT WAS IN HER EARLY 30'S   . Breast cancer ([place])   . Colon polyp, hyperplastic   . Depression    DEPRESSION RELATED TO HX BREAST CANCER/ ARIMEDEX SIDE EFFECTS- NO LONGER ON ARIMEDEX - AND DOING BETTER  . GERD (gastroesophageal reflux disease)   . Hypercholesterolemia   . Hypertension   . Personal history of radiation therapy   . Right trigger finger    Past Surgical History:  Procedure Laterality Date  . ABDOMINAL HYSTERECTOMY  1984  . BREAST LUMPECTOMY Left 2010  . BREAST SURGERY  2010   LEFT BREAST LUMPECTOMY AND AXILLARY NODE DISSECTION  . CARPAL TUNNEL RELEASE Right 2013  . CHOLECYSTECTOMY  1999  . INCISIONAL HERNIA REPAIR N/A 10/22/2013   Procedure: LAPAROSCOPIC INCISIONAL HERNIA;  Surgeon: Harl Bowie, MD;  Location: [facility];  Service: General;  Laterality: N/A;  . INSERTION OF MESH N/A 10/22/2013   Procedure: INSERTION OF MESH;  Surgeon: Harl Bowie, MD;  Location: [facility];  Service: General;  Laterality: N/A;  . NASAL  SINUS SURGERY  1987  . right knee arthroscopy Right 2005   Social History   Socioeconomic History  . Marital status: Married    Spouse name: Not on file  . Number of children: Not on file  . Years of education: Not on file  . Highest education level: Not on file  Occupational History  . Not on file  Social Needs  . Financial resource strain: Not on file  . Food insecurity:    Worry: Not on file    Inability: Not on file  . Transportation needs:    Medical: Not on file    Non-medical: Not on file  Tobacco Use  . Smoking status: Never Smoker  . Smokeless tobacco: Never Used  Substance and Sexual Activity  . Alcohol use: No  . Drug use: No  . Sexual activity: Not on file  Lifestyle  . Physical activity:    Days per week: Not on file    Minutes per session: Not on file  . Stress: Not on file  Relationships  . Social connections:    Talks on phone: Not on file    Gets together: Not on file    Attends religious service: Not on file    Active member of club or organization: Not on file    Attends meetings of clubs or organizations: Not on file    Relationship status: Not on file  Other  Topics Concern  . Not on file  Social History Narrative  . Not on file   No Known Allergies Family History  Problem Relation Age of Onset  . Heart attack Mother 45  . Atrial fibrillation Mother   . Heart disease Father        CABG 4V, two stents since  . Heart disease Paternal Grandfather   . Breast cancer Neg Hx      Current Outpatient Medications (Cardiovascular):  .  hydrochlorothiazide (HYDRODIURIL) 12.5 MG tablet,  .  lisinopril (PRINIVIL,ZESTRIL) 30 MG tablet,  .  simvastatin (ZOCOR) 20 MG tablet, Take 40 mg by mouth at bedtime.    Current Outpatient Medications (Analgesics):  .  aspirin EC 81 MG tablet, Take 81 mg by mouth daily.   Current Outpatient Medications (Other):  Marland Kitchen  FIBER SELECT GUMMIES PO, Take 1 capsule by mouth daily.  .  Ginkgo Biloba 40 MG TABS, Take  40 mg by mouth daily.  .  Glucosamine-Chondroit-Vit C-Mn (GLUCOSAMINE 1500 COMPLEX PO), Take 1 tablet by mouth daily.  .  Multiple Vitamin (MULTIVITAMIN WITH MINERALS) TABS tablet, Take 1 tablet by mouth daily. .  pantoprazole (PROTONIX) 40 MG tablet, Take 40 mg by mouth at bedtime.  Marland Kitchen  venlafaxine XR (EFFEXOR-XR) 37.5 MG 24 hr capsule, TAKE 1 CAPSULE BY MOUTH  DAILY .  vitamin E 1000 UNIT capsule, Take 1,000 Units by mouth daily. .  Vitamin D, Ergocalciferol, (DRISDOL) 1.25 MG (50000 UT) CAPS capsule, Take 1 capsule (50,000 Units total) by mouth every 7 (seven) days.    Past medical history, social, surgical and family history all reviewed in electronic medical record.  No pertanent information unless stated regarding to the chief complaint.   Review of Systems:  No headache, visual changes, nausea, vomiting, diarrhea, constipation, dizziness, abdominal pain, skin rash, fevers, chills, night sweats, weight loss, swollen lymph nodes, body aches, joint swelling, chest pain, shortness of breath, mood changes.  Positive muscle aches  Objective  Blood pressure (!) 134/94, pulse 71, height 5\' 2"  (1.575 m), weight 143 lb (64.9 kg), SpO2 98 %.   General: No apparent distress alert and oriented x3 mood and affect normal, dressed appropriately.  HEENT: Pupils equal, extraocular movements intact  Respiratory: Patient's speak in full sentences and does not appear short of breath  Cardiovascular: No lower extremity edema, non tender, no erythema  Skin: Warm dry intact with no signs of infection or rash on extremities or on axial skeleton.  Abdomen: Soft nontender  Neuro: Cranial nerves II through XII are intact, neurovascularly intact in all extremities with 2+ DTRs and 2+ pulses.  Lymph: No lymphadenopathy of posterior or anterior cervical chain or axillae bilaterally.  Gait mild antalgic MSK:  tender with full range of motion and good stability and symmetric strength and tone of shoulders, elbows,  wrist, hip, and ankles bilaterally.  Knee exam: bilateral lateral tracking of the patella.  Right greater than left.  Positive patella grind on the right side.  Patient does not have any significant instability.  Mild narrowing over the medial and lateral joint pain.  No deformity.  Full range of motion noted.  Right foot exam shows the patient does have severe midfoot arthritic changes with bowing noted.  Patient has mild pes cavus as well.  Breakdown of the transverse arch with bunion and bunionette formation.  Limited musculoskeletal ultrasound was performed and interpreted by Lyndal Pulley  Limited ultrasound of the patellofemoral joint on the right side does show moderate  to severe patellofemoral arthritis but otherwise fairly unremarkable with very mild narrowing of the medial lateral joint space Impression: Patellofemoral arthritis   Impression and Recommendations:     This case required medical decision making of moderate complexity. The above documentation has been reviewed and is accurate and complete Lyndal Pulley, DO       Note: This dictation was prepared with Dragon dictation along with smaller phrase technology. Any transcriptional errors that result from this process are unintentional.

## 2018-04-16 NOTE — Patient Instructions (Addendum)
Good to see you  Ice is you friend Stay active  [employer] orthotics "total support" online would be great  Shoes look at [facility], new balance 990, or other rocker bottom shoes.  Do not lace the middle eye  Once weekly vitamin D and stop daily for now  Continue the other vitamins Exercises 3 times a week.   Wear the knee brace as noted.  When in severe pain try the ibuprofen and see how you feel. Can take up to 3 in a day if really bad.  See me again in 4 weeks and if not better we will do injection

## 2018-04-19 ENCOUNTER — Ambulatory Visit: Payer: PPO | Admitting: Family Medicine

## 2018-05-07 DIAGNOSIS — H2513 Age-related nuclear cataract, bilateral: Secondary | ICD-10-CM | POA: Diagnosis not present

## 2018-05-14 DIAGNOSIS — L820 Inflamed seborrheic keratosis: Secondary | ICD-10-CM | POA: Diagnosis not present

## 2018-05-14 DIAGNOSIS — D2262 Melanocytic nevi of left upper limb, including shoulder: Secondary | ICD-10-CM | POA: Diagnosis not present

## 2018-05-14 DIAGNOSIS — D2271 Melanocytic nevi of right lower limb, including hip: Secondary | ICD-10-CM | POA: Diagnosis not present

## 2018-05-14 DIAGNOSIS — D2272 Melanocytic nevi of left lower limb, including hip: Secondary | ICD-10-CM | POA: Diagnosis not present

## 2018-05-14 DIAGNOSIS — L298 Other pruritus: Secondary | ICD-10-CM | POA: Diagnosis not present

## 2018-05-14 DIAGNOSIS — L538 Other specified erythematous conditions: Secondary | ICD-10-CM | POA: Diagnosis not present

## 2018-05-14 DIAGNOSIS — D225 Melanocytic nevi of trunk: Secondary | ICD-10-CM | POA: Diagnosis not present

## 2018-05-14 DIAGNOSIS — D2261 Melanocytic nevi of right upper limb, including shoulder: Secondary | ICD-10-CM | POA: Diagnosis not present

## 2018-05-14 DIAGNOSIS — L821 Other seborrheic keratosis: Secondary | ICD-10-CM | POA: Diagnosis not present

## 2018-05-16 ENCOUNTER — Ambulatory Visit: Payer: PPO | Admitting: Family Medicine

## 2018-05-16 DIAGNOSIS — M19079 Primary osteoarthritis, unspecified ankle and foot: Secondary | ICD-10-CM

## 2018-05-16 DIAGNOSIS — M1710 Unilateral primary osteoarthritis, unspecified knee: Secondary | ICD-10-CM | POA: Diagnosis not present

## 2018-05-16 NOTE — Progress Notes (Signed)
[name] [place], [place] [address] Phone: [phone] Subjective:   Carolyn Lowe, am serving as a scribe for Dr. Hulan Saas.     CC: Knee and foot pain follow-up  [id number]    04/16/2018: Midfoot arthritis.  Discussed shoes and rocker-bottom shoes discussed with patient and icing regimen, topical anti-inflammatories, which activities to do which wants to avoid.  Patient is to increase activity slowly over the course the next several days.  Follow-up again in 4 to 6 weeks  Patellofemoral arthritis.  Discussed with patient in great length.  Discussed icing regimen and home exercise.  Discussed which activities to do which wants to avoid.  Declined any type of injection today.  Tru pull lite brace given today.  Topical anti-inflammatories discussed.  Patient will follow-up with me again in 4 weeks  Update 05/16/2018: Carolyn Lowe is a 68 y.o. female coming in with complaint of back and knee pain. Patient states that her right knee is doing better but does still have pain with prolonged walking. Does wear a brace. Left knee pain is worse and is hurting all the way up to the hip.   Past Medical History:  Diagnosis Date  . Arthritis   . Brain aneurysm    Resolved spontaneously- IT SEALED IT SELF - PT WAS IN HER EARLY 30'S   . Breast cancer ([place])   . Colon polyp, hyperplastic   . Depression    DEPRESSION RELATED TO HX BREAST CANCER/ ARIMEDEX SIDE EFFECTS- Lowe LONGER ON ARIMEDEX - AND DOING BETTER  . GERD (gastroesophageal reflux disease)   . Hypercholesterolemia   . Hypertension   . Personal history of radiation therapy   . Right trigger finger    Past Surgical History:  Procedure Laterality Date  . ABDOMINAL HYSTERECTOMY  1984  . BREAST LUMPECTOMY Left 2010  . BREAST SURGERY  2010   LEFT BREAST LUMPECTOMY AND AXILLARY NODE DISSECTION  . CARPAL TUNNEL RELEASE Right 2013  . CHOLECYSTECTOMY  1999  . INCISIONAL HERNIA  REPAIR N/A 10/22/2013   Procedure: LAPAROSCOPIC INCISIONAL HERNIA;  Surgeon: Harl Bowie, MD;  Location: [facility];  Service: General;  Laterality: N/A;  . INSERTION OF MESH N/A 10/22/2013   Procedure: INSERTION OF MESH;  Surgeon: Harl Bowie, MD;  Location: [facility];  Service: General;  Laterality: N/A;  . NASAL SINUS SURGERY  1987  . right knee arthroscopy Right 2005   Social History   Socioeconomic History  . Marital status: Married    Spouse name: Not on file  . Number of children: Not on file  . Years of education: Not on file  . Highest education level: Not on file  Occupational History  . Not on file  Social Needs  . Financial resource strain: Not on file  . Food insecurity:    Worry: Not on file    Inability: Not on file  . Transportation needs:    Medical: Not on file    Non-medical: Not on file  Tobacco Use  . Smoking status: Never Smoker  . Smokeless tobacco: Never Used  Substance and Sexual Activity  . Alcohol use: Lowe  . Drug use: Lowe  . Sexual activity: Not on file  Lifestyle  . Physical activity:    Days per week: Not on file    Minutes per session: Not on file  . Stress: Not on file  Relationships  . Social connections:    Talks on  phone: Not on file    Gets together: Not on file    Attends religious service: Not on file    Active member of club or organization: Not on file    Attends meetings of clubs or organizations: Not on file    Relationship status: Not on file  Other Topics Concern  . Not on file  Social History Narrative  . Not on file   Lowe Known Allergies Family History  Problem Relation Age of Onset  . Heart attack Mother 38  . Atrial fibrillation Mother   . Heart disease Father        CABG 4V, two stents since  . Heart disease Paternal Grandfather   . Breast cancer Neg Hx      Current Outpatient Medications (Cardiovascular):  .  hydrochlorothiazide (HYDRODIURIL) 12.5 MG tablet,  .  lisinopril (PRINIVIL,ZESTRIL) 30 MG  tablet,  .  simvastatin (ZOCOR) 20 MG tablet, Take 40 mg by mouth at bedtime.    Current Outpatient Medications (Analgesics):  .  aspirin EC 81 MG tablet, Take 81 mg by mouth daily.  Current Outpatient Medications (Hematological):  Marland Kitchen  Cyanocobalamin (B-12) 1000 MCG TABS, Take 1,000 mg by mouth.  Current Outpatient Medications (Other):  Marland Kitchen  APPLE CIDER VINEGAR PO, Take by mouth. .  donepezil (ARICEPT) 5 MG tablet, Take 5 mg by mouth at bedtime. .  Misc Natural Products (TART CHERRY ADVANCED PO), Take by mouth. .  Multiple Vitamin (MULTIVITAMIN WITH MINERALS) TABS tablet, Take 1 tablet by mouth daily. .  pantoprazole (PROTONIX) 40 MG tablet, Take 40 mg by mouth at bedtime.  .  pyridoxine (B-6) 100 MG tablet, Take 100 mg by mouth daily. .  traZODone (DESYREL) 50 MG tablet, Take 50 mg by mouth at bedtime. .  TURMERIC PO, Take by mouth. .  venlafaxine XR (EFFEXOR-XR) 37.5 MG 24 hr capsule, TAKE 1 CAPSULE BY MOUTH  DAILY .  vitamin E 1000 UNIT capsule, Take 1,000 Units by mouth daily.    Past medical history, social, surgical and family history all reviewed in electronic medical record.  Lowe pertanent information unless stated regarding to the chief complaint.   Review of Systems:  Lowe headache, visual changes, nausea, vomiting, diarrhea, constipation, dizziness, abdominal pain, skin rash, fevers, chills, night sweats, weight loss, swollen lymph nodes, body aches, joint swelling, muscle aches, chest pain, shortness of breath, mood changes.  Positive muscle aches  Objective  Blood pressure 116/72, pulse 77, height 5\' 2"  (1.575 m), weight 143 lb (64.9 kg), SpO2 99 %.    General: Lowe apparent distress alert and oriented x3 mood and affect normal, dressed appropriately.  HEENT: Pupils equal, extraocular movements intact  Respiratory: Patient's speak in full sentences and does not appear short of breath  Cardiovascular: Lowe lower extremity edema, non tender, Lowe erythema  Skin: Warm dry intact  with Lowe signs of infection or rash on extremities or on axial skeleton.  Abdomen: Soft nontender  Neuro: Cranial nerves II through XII are intact, neurovascularly intact in all extremities with 2+ DTRs and 2+ pulses.  Lymph: Lowe lymphadenopathy of posterior or anterior cervical chain or axillae bilaterally.  Gait normal with good balance and coordination.  MSK:  Non tender with full range of motion and good stability and symmetric strength and tone of shoulders, elbows, wrist, hip, knee and ankles bilaterally.  Knee: Bilateral valgus deformity noted.  Abnormal thigh to calf ratio.  Tender to palpation over medial and PF joint line.  ROM full in  flexion and extension and lower leg rotation. instability with valgus force.  painful patellar compression. Patellar glide with moderate crepitus. Patellar and quadriceps tendons unremarkable. Hamstring and quadriceps strength is normal. Contralateral knee shows  After informed written and verbal consent, patient was seated on exam table. Right knee was prepped with alcohol swab and utilizing anterolateral approach, patient's right knee space was injected with 4:1  marcaine 0.5%: Kenalog 40mg /dL. Patient tolerated the procedure well without immediate complications.     Impression and Recommendations:     This case required medical decision making of moderate complexity. The above documentation has been reviewed and is accurate and complete Lyndal Pulley, DO       Note: This dictation was prepared with Dragon dictation along with smaller phrase technology. Any transcriptional errors that result from this process are unintentional.

## 2018-05-16 NOTE — Patient Instructions (Signed)
Good to see you  Ice is your friend Keep doing the brace on the right knee for now.  Injected the knee We will get approval with the other injections Arnica lotion to the feet 23 times a day could help a little   See me again in 4-6 weeks

## 2018-05-16 NOTE — Assessment & Plan Note (Signed)
Patient has changed shoes.  Does have the arthritic changes.  Discussed with patient continue to be active otherwise.  Avoid being barefoot, topical anti-inflammatories, worsening symptoms will consider injections or custom orthotics.

## 2018-05-16 NOTE — Assessment & Plan Note (Signed)
Lateral injections given today.  Discussed icing regimen and home exercise, topical anti-inflammatories, and patient could be a candidate for Visco supplementation.  Continue all other conservative therapy and follow-up again in 4 to 6 weeks

## 2018-06-21 ENCOUNTER — Ambulatory Visit: Payer: PPO | Admitting: Family Medicine

## 2018-06-23 NOTE — Progress Notes (Signed)
[facility] [place], [place] [address] Phone: [phone] Subjective:    [name], am serving as a scribe for Dr. Hulan Saas.  CC: Knee pain follow-up  [id number]   05/16/2018: Lateral injections given today.  Discussed icing regimen and home exercise, topical anti-inflammatories, and patient could be a candidate for Visco supplementation.  Continue all other conservative therapy and follow-up again in 4 to 6 weeks  06/24/2018: Carolyn Lowe is a 68 y.o. female coming in with complaint of knee pain. Patient states that she has not been having any pain since last visit. Said that staying at home is helping to decrease her knee pain.    States that she is approximately 85 to 90% better.     Past Medical History:  Diagnosis Date  . Arthritis   . Brain aneurysm    Resolved spontaneously- IT SEALED IT SELF - PT WAS IN HER EARLY 30'S   . Breast cancer ([place])   . Colon polyp, hyperplastic   . Depression    DEPRESSION RELATED TO HX BREAST CANCER/ ARIMEDEX SIDE EFFECTS- NO LONGER ON ARIMEDEX - AND DOING BETTER  . GERD (gastroesophageal reflux disease)   . Hypercholesterolemia   . Hypertension   . Personal history of radiation therapy   . Right trigger finger    Past Surgical History:  Procedure Laterality Date  . ABDOMINAL HYSTERECTOMY  1984  . BREAST LUMPECTOMY Left 2010  . BREAST SURGERY  2010   LEFT BREAST LUMPECTOMY AND AXILLARY NODE DISSECTION  . CARPAL TUNNEL RELEASE Right 2013  . CHOLECYSTECTOMY  1999  . INCISIONAL HERNIA REPAIR N/A 10/22/2013   Procedure: LAPAROSCOPIC INCISIONAL HERNIA;  Surgeon: Harl Bowie, MD;  Location: [facility];  Service: General;  Laterality: N/A;  . INSERTION OF MESH N/A 10/22/2013   Procedure: INSERTION OF MESH;  Surgeon: Harl Bowie, MD;  Location: [facility];  Service: General;  Laterality: N/A;  . NASAL SINUS SURGERY  1987  . right knee arthroscopy Right 2005   Social History    Socioeconomic History  . Marital status: Married    Spouse name: Not on file  . Number of children: Not on file  . Years of education: Not on file  . Highest education level: Not on file  Occupational History  . Not on file  Social Needs  . Financial resource strain: Not on file  . Food insecurity:    Worry: Not on file    Inability: Not on file  . Transportation needs:    Medical: Not on file    Non-medical: Not on file  Tobacco Use  . Smoking status: Never Smoker  . Smokeless tobacco: Never Used  Substance and Sexual Activity  . Alcohol use: No  . Drug use: No  . Sexual activity: Not on file  Lifestyle  . Physical activity:    Days per week: Not on file    Minutes per session: Not on file  . Stress: Not on file  Relationships  . Social connections:    Talks on phone: Not on file    Gets together: Not on file    Attends religious service: Not on file    Active member of club or organization: Not on file    Attends meetings of clubs or organizations: Not on file    Relationship status: Not on file  Other Topics Concern  . Not on file  Social History Narrative  . Not on  file   No Known Allergies Family History  Problem Relation Age of Onset  . Heart attack Mother 35  . Atrial fibrillation Mother   . Heart disease Father        CABG 4V, two stents since  . Heart disease Paternal Grandfather   . Breast cancer Neg Hx      Current Outpatient Medications (Cardiovascular):  .  hydrochlorothiazide (HYDRODIURIL) 12.5 MG tablet,  .  lisinopril (PRINIVIL,ZESTRIL) 30 MG tablet,  .  simvastatin (ZOCOR) 20 MG tablet, Take 40 mg by mouth at bedtime.    Current Outpatient Medications (Analgesics):  .  aspirin EC 81 MG tablet, Take 81 mg by mouth daily.  Current Outpatient Medications (Hematological):  Marland Kitchen  Cyanocobalamin (B-12) 1000 MCG TABS, Take 1,000 mg by mouth.  Current Outpatient Medications (Other):  Marland Kitchen  APPLE CIDER VINEGAR PO, Take by mouth. .  donepezil  (ARICEPT) 5 MG tablet, Take 5 mg by mouth at bedtime. .  Misc Natural Products (TART CHERRY ADVANCED PO), Take by mouth. .  Multiple Vitamin (MULTIVITAMIN WITH MINERALS) TABS tablet, Take 1 tablet by mouth daily. .  pantoprazole (PROTONIX) 40 MG tablet, Take 40 mg by mouth at bedtime.  .  pyridoxine (B-6) 100 MG tablet, Take 100 mg by mouth daily. .  traZODone (DESYREL) 50 MG tablet, Take 50 mg by mouth at bedtime. .  TURMERIC PO, Take by mouth. .  venlafaxine XR (EFFEXOR-XR) 37.5 MG 24 hr capsule, TAKE 1 CAPSULE BY MOUTH  DAILY .  vitamin E 1000 UNIT capsule, Take 1,000 Units by mouth daily.    Past medical history, social, surgical and family history all reviewed in electronic medical record.  No pertanent information unless stated regarding to the chief complaint.   Review of Systems:  No headache, visual changes, nausea, vomiting, diarrhea, constipation, dizziness, abdominal pain, skin rash, fevers, chills, night sweats, weight loss, swollen lymph nodes, body aches, joint swelling, muscle aches, chest pain, shortness of breath, mood changes.   Objective  Blood pressure 120/72, pulse 77, height 5\' 2"  (1.575 m), weight 140 lb (63.5 kg), SpO2 94 %. f    General: No apparent distress alert and oriented x3 mood and affect normal, dressed appropriately.  HEENT: Pupils equal, extraocular movements intact  Respiratory: Patient's speak in full sentences and does not appear short of breath  Cardiovascular: No lower extremity edema, non tender, no erythema  Skin: Warm dry intact with no signs of infection or rash on extremities or on axial skeleton.  Abdomen: Soft nontender  Neuro: Cranial nerves II through XII are intact, neurovascularly intact in all extremities with 2+ DTRs and 2+ pulses.  Lymph: No lymphadenopathy of posterior or anterior cervical chain or axillae bilaterally.  Gait normal with good balance and coordination.  MSK:  Non tender with full range of motion and good stability  and symmetric strength and tone of shoulders, elbows, wrist, hip and ankles bilaterally.  Knee: Lateral Mild varus deformity noted Tender to palpation over medial and PF joint line.  Significant improvement from previous exam  ROM full in flexion and extension and lower leg rotation..  painful patellar compression. Patellar glide with moderate crepitus. Patellar and quadriceps tendons unremarkable. Hamstring and quadriceps strength is normal.    Impression and Recommendations:      The above documentation has been reviewed and is accurate and complete Lyndal Pulley, DO       Note: This dictation was prepared with Dragon dictation along with smaller phrase technology. Any  transcriptional errors that result from this process are unintentional.

## 2018-06-24 ENCOUNTER — Ambulatory Visit (INDEPENDENT_AMBULATORY_CARE_PROVIDER_SITE_OTHER): Payer: PPO | Admitting: Family Medicine

## 2018-06-24 ENCOUNTER — Encounter: Payer: Self-pay | Admitting: Family Medicine

## 2018-06-24 ENCOUNTER — Other Ambulatory Visit: Payer: Self-pay

## 2018-06-24 DIAGNOSIS — M1710 Unilateral primary osteoarthritis, unspecified knee: Secondary | ICD-10-CM | POA: Diagnosis not present

## 2018-06-24 NOTE — Patient Instructions (Signed)
Good to see you  Carolyn Lowe is your friend Stay active but less stairs ;) Call [phone] when you need [place]!

## 2018-06-24 NOTE — Assessment & Plan Note (Signed)
Significant improvement after the injection.  No significant change in management.  Patient is not working more at this moment.  Not wearing the brace as much either because she is not walking as much.  Encourage patient to monitor weight.  Follow-up with me again more on an as-needed basis.

## 2018-12-01 NOTE — Progress Notes (Signed)
[facility] [address], [place] [address] Phone: [phone] Subjective:   [name], am serving as a scribe for Dr. Hulan Saas.   CC: Bilateral knee pain follow-up  QA:[id number]   06/24/2018 Carolyn Lowe is a 68 y.o. female coming in with complaint of knee pain.  Significant improvement after the injection.  No significant change in management.  Patient is not working more at this moment.  Not wearing the brace as much either because she is not walking as much.  Encourage patient to monitor weight.  Follow-up with me again more on an as-needed basis.  Update 12/03/2018 Had some relief from the injections. Is not working at the [facility] right now so hasn't been on her feet as much. Knee pain has improved but would like injections today of visco.    Known arthritic changes of the knees bilaterally.  Past Medical History:  Diagnosis Date   Arthritis    Brain aneurysm    Resolved spontaneously- IT SEALED IT SELF - PT WAS IN HER EARLY 30'S    Breast cancer (HCC)    Colon polyp, hyperplastic    Depression    DEPRESSION RELATED TO HX BREAST CANCER/ ARIMEDEX SIDE EFFECTS- NO LONGER ON ARIMEDEX - AND DOING BETTER   GERD (gastroesophageal reflux disease)    Hypercholesterolemia    Hypertension    Personal history of radiation therapy    Right trigger finger    Past Surgical History:  Procedure Laterality Date   ABDOMINAL HYSTERECTOMY  1984   BREAST LUMPECTOMY Left 2010   BREAST SURGERY  2010   LEFT BREAST LUMPECTOMY AND AXILLARY NODE DISSECTION   CARPAL TUNNEL RELEASE Right 2013   CHOLECYSTECTOMY  1999   INCISIONAL HERNIA REPAIR N/A 10/22/2013   Procedure: LAPAROSCOPIC INCISIONAL HERNIA;  Surgeon: Harl Bowie, MD;  Location: [facility];  Service: General;  Laterality: N/A;   INSERTION OF MESH N/A 10/22/2013   Procedure: INSERTION OF MESH;  Surgeon: Harl Bowie, MD;  Location: [facility];  Service: General;   Laterality: N/A;   NASAL SINUS SURGERY  1987   right knee arthroscopy Right 2005   Social History   Socioeconomic History   Marital status: Married    Spouse name: Not on file   Number of children: Not on file   Years of education: Not on file   Highest education level: Not on file  Occupational History   Not on file  Social Needs   Financial resource strain: Not on file   Food insecurity    Worry: Not on file    Inability: Not on file   Transportation needs    Medical: Not on file    Non-medical: Not on file  Tobacco Use   Smoking status: Never Smoker   Smokeless tobacco: Never Used  Substance and Sexual Activity   Alcohol use: No   Drug use: No   Sexual activity: Not on file  Lifestyle   Physical activity    Days per week: Not on file    Minutes per session: Not on file   Stress: Not on file  Relationships   Social connections    Talks on phone: Not on file    Gets together: Not on file    Attends religious service: Not on file    Active member of club or organization: Not on file    Attends meetings of clubs or organizations: Not on file    Relationship  status: Not on file  Other Topics Concern   Not on file  Social History Narrative   Not on file   No Known Allergies Family History  Problem Relation Age of Onset   Heart attack Mother 75   Atrial fibrillation Mother    Heart disease Father        CABG 4V, two stents since   Heart disease Paternal Grandfather    Breast cancer Neg Hx      Current Outpatient Medications (Cardiovascular):    hydrochlorothiazide (HYDRODIURIL) 12.5 MG tablet,    lisinopril (PRINIVIL,ZESTRIL) 30 MG tablet,    simvastatin (ZOCOR) 20 MG tablet, Take 40 mg by mouth at bedtime.    Current Outpatient Medications (Analgesics):    aspirin EC 81 MG tablet, Take 81 mg by mouth daily.  Current Outpatient Medications (Hematological):    Cyanocobalamin (B-12) 1000 MCG TABS, Take 1,000 mg by  mouth.  Current Outpatient Medications (Other):    APPLE CIDER VINEGAR PO, Take by mouth.   donepezil (ARICEPT) 5 MG tablet, Take 5 mg by mouth at bedtime.   Misc Natural Products (TART CHERRY ADVANCED PO), Take by mouth.   Multiple Vitamin (MULTIVITAMIN WITH MINERALS) TABS tablet, Take 1 tablet by mouth daily.   pantoprazole (PROTONIX) 40 MG tablet, Take 40 mg by mouth at bedtime.    pyridoxine (B-6) 100 MG tablet, Take 100 mg by mouth daily.   traZODone (DESYREL) 50 MG tablet, Take 50 mg by mouth at bedtime.   TURMERIC PO, Take by mouth.   venlafaxine XR (EFFEXOR-XR) 37.5 MG 24 hr capsule, TAKE 1 CAPSULE BY MOUTH  DAILY   vitamin E 1000 UNIT capsule, Take 1,000 Units by mouth daily.    Past medical history, social, surgical and family history all reviewed in electronic medical record.  No pertanent information unless stated regarding to the chief complaint.   Review of Systems:  No headache, visual changes, nausea, vomiting, diarrhea, constipation, dizziness, abdominal pain, skin rash, fevers, chills, night sweats, weight loss, swollen lymph nodes, body aches, joint swelling,, chest pain, shortness of breath, mood changes.  Positive muscle aches  Objective  Blood pressure 140/88, pulse 74, height 5\' 2"  (1.575 m), weight 137 lb (62.1 kg), SpO2 98 %.    General: No apparent distress alert and oriented x3 mood and affect normal, dressed appropriately.  HEENT: Pupils equal, extraocular movements intact  Respiratory: Patient's speak in full sentences and does not appear short of breath  Cardiovascular: No lower extremity edema, non tender, no erythema  Skin: Warm dry intact with no signs of infection or rash on extremities or on axial skeleton.  Abdomen: Soft nontender  Neuro: Cranial nerves II through XII are intact, neurovascularly intact in all extremities with 2+ DTRs and 2+ pulses.  Lymph: No lymphadenopathy of posterior or anterior cervical chain or axillae bilaterally.   Gait antalgic Normal.  MSK:  Non tender with full range of motion and good stability and symmetric strength and tone of shoulders, elbows, wrist, hip and ankles bilaterally.  Knee: Bilateral Minimal deformity noted.  Mild abnormal thigh to calf ratio.  Tender to palpation over  PF joint line.  ROM full in flexion and extension and lower leg rotation. instability with valgus force.  painful patellar compression. Patellar glide with moderate crepitus. Patellar and quadriceps tendons unremarkable. Hamstring and quadriceps strength is normal.   After informed written and verbal consent, patient was seated on exam table. Right knee was prepped with alcohol swab and utilizing anterolateral approach,  patient's right knee space was injected wit 22 mg/mL of Monovisc (sodium hyaluronate) in a prefilled syringe was injected easily into the knee through a 22-gauge needle..Patient tolerated the procedure well without immediate complications.  After informed written and verbal consent, patient was seated on exam table. Left knee was prepped with alcohol swab and utilizing anterolateral approach, patient's left knee space was injected with 22 mg/mL of Monovisc (sodium hyaluronate) in a prefilled syringe was injected easily into the knee through a 22-gauge needle..Patient tolerated the procedure well without immediate complications.   Impression and Recommendations:     This case required medical decision making of moderate complexity. The above documentation has been reviewed and is accurate and complete Lyndal Pulley, DO       Note: This dictation was prepared with Dragon dictation along with smaller phrase technology. Any transcriptional errors that result from this process are unintentional.

## 2018-12-03 ENCOUNTER — Other Ambulatory Visit: Payer: Self-pay

## 2018-12-03 ENCOUNTER — Ambulatory Visit: Payer: PPO | Admitting: Family Medicine

## 2018-12-03 ENCOUNTER — Encounter: Payer: Self-pay | Admitting: Family Medicine

## 2018-12-03 DIAGNOSIS — M1710 Unilateral primary osteoarthritis, unspecified knee: Secondary | ICD-10-CM

## 2018-12-03 DIAGNOSIS — M170 Bilateral primary osteoarthritis of knee: Secondary | ICD-10-CM | POA: Diagnosis not present

## 2018-12-03 NOTE — Patient Instructions (Signed)
Monovisc injections today Voltaren gel 2x a day as needed Repeat injections every 6 months if needed

## 2018-12-03 NOTE — Assessment & Plan Note (Signed)
Bilateral injections given today.  Tolerated the procedure well.  Discussed icing regimen and home exercises, patient could always go back to steroid injection if needed.  Continue bracing intermittently.  Follow-up with me again 12 weeks.  [place] she will follow-up as needed.

## 2018-12-12 DIAGNOSIS — M170 Bilateral primary osteoarthritis of knee: Secondary | ICD-10-CM | POA: Insufficient documentation

## 2018-12-12 NOTE — Assessment & Plan Note (Signed)
Bilateral injections given.  Tolerated the procedure well.  Discussed icing regimen and home exercises, topical anti-inflammatories.  Good candidate for Visco supplementation again.  Follow-up again in 4 to 8 weeks

## 2018-12-19 DIAGNOSIS — R5383 Other fatigue: Secondary | ICD-10-CM | POA: Diagnosis not present

## 2018-12-19 DIAGNOSIS — Z6826 Body mass index (BMI) 26.0-26.9, adult: Secondary | ICD-10-CM | POA: Diagnosis not present

## 2018-12-19 DIAGNOSIS — I10 Essential (primary) hypertension: Secondary | ICD-10-CM | POA: Diagnosis not present

## 2018-12-19 DIAGNOSIS — R7309 Other abnormal glucose: Secondary | ICD-10-CM | POA: Diagnosis not present

## 2018-12-19 DIAGNOSIS — Z13220 Encounter for screening for lipoid disorders: Secondary | ICD-10-CM | POA: Diagnosis not present

## 2019-03-21 HISTORY — PX: BREAST LUMPECTOMY: SHX2

## 2019-07-07 LAB — HM DEXA SCAN: HM Dexa Scan: NORMAL

## 2019-07-17 LAB — HM MAMMOGRAPHY

## 2020-10-19 LAB — HM MAMMOGRAPHY

## 2021-11-25 LAB — HM MAMMOGRAPHY

## 2021-11-25 LAB — HM DEXA SCAN

## 2022-04-08 LAB — BASIC METABOLIC PANEL
BUN: 25 — AB (ref 4–21)
CO2: 29 — AB (ref 13–22)
Chloride: 103 (ref 99–108)
Creatinine: 1.1 (ref 0.5–1.1)
Glucose: 90
Potassium: 5.5 meq/L — AB (ref 3.5–5.1)
Sodium: 142 (ref 137–147)

## 2022-04-08 LAB — CBC AND DIFFERENTIAL
HCT: 44 (ref 36–46)
Hemoglobin: 14.9 (ref 12.0–16.0)
Platelets: 363 10*3/uL (ref 150–400)
WBC: 8.1

## 2022-04-08 LAB — LIPID PANEL
Cholesterol: 268 — AB (ref 0–200)
HDL: 54 (ref 35–70)
LDL Cholesterol: 161
Triglycerides: 264 — AB (ref 40–160)

## 2022-04-08 LAB — HEPATIC FUNCTION PANEL
ALT: 70 U/L — AB (ref 7–35)
AST: 28 (ref 13–35)
Alkaline Phosphatase: 66 (ref 25–125)
Bilirubin, Total: 0.5

## 2022-04-08 LAB — CBC: RBC: 4.43 (ref 3.87–5.11)

## 2022-04-08 LAB — COMPREHENSIVE METABOLIC PANEL
Albumin: 4.4 (ref 3.5–5.0)
Calcium: 10.5 (ref 8.7–10.7)
eGFR: 57

## 2022-05-15 LAB — LAB REPORT - SCANNED: EGFR: 46

## 2022-10-02 LAB — LAB REPORT - SCANNED: EGFR: 49

## 2022-12-08 LAB — HM MAMMOGRAPHY

## 2022-12-26 ENCOUNTER — Other Ambulatory Visit: Payer: Self-pay | Admitting: Internal Medicine

## 2022-12-27 ENCOUNTER — Encounter: Payer: Self-pay | Admitting: Internal Medicine

## 2022-12-27 ENCOUNTER — Ambulatory Visit: Payer: Medicare HMO | Admitting: Internal Medicine

## 2022-12-27 VITALS — BP 112/76 | HR 47 | Ht 62.0 in | Wt 126.0 lb

## 2022-12-27 DIAGNOSIS — Z23 Encounter for immunization: Secondary | ICD-10-CM | POA: Diagnosis not present

## 2022-12-27 DIAGNOSIS — I10 Essential (primary) hypertension: Secondary | ICD-10-CM

## 2022-12-27 DIAGNOSIS — F5101 Primary insomnia: Secondary | ICD-10-CM | POA: Diagnosis not present

## 2022-12-27 DIAGNOSIS — F324 Major depressive disorder, single episode, in partial remission: Secondary | ICD-10-CM | POA: Insufficient documentation

## 2022-12-27 DIAGNOSIS — Z853 Personal history of malignant neoplasm of breast: Secondary | ICD-10-CM | POA: Diagnosis not present

## 2022-12-27 DIAGNOSIS — E782 Mixed hyperlipidemia: Secondary | ICD-10-CM

## 2022-12-27 DIAGNOSIS — J309 Allergic rhinitis, unspecified: Secondary | ICD-10-CM

## 2022-12-27 DIAGNOSIS — K219 Gastro-esophageal reflux disease without esophagitis: Secondary | ICD-10-CM

## 2022-12-27 DIAGNOSIS — G3184 Mild cognitive impairment, so stated: Secondary | ICD-10-CM | POA: Insufficient documentation

## 2022-12-27 DIAGNOSIS — M170 Bilateral primary osteoarthritis of knee: Secondary | ICD-10-CM

## 2022-12-27 MED ORDER — MIRTAZAPINE 15 MG PO TABS: 15.0000 mg | ORAL_TABLET | Freq: Every day | ORAL | 1 refills | Status: DC

## 2022-12-27 MED ORDER — AZELASTINE HCL 0.1 % NA SOLN: 2.0000 | Freq: Two times a day (BID) | NASAL | 12 refills | Status: DC

## 2022-12-27 MED ORDER — SHINGRIX 50 MCG/0.5ML IM SUSR
0.5000 mL | Freq: Once | INTRAMUSCULAR | 1 refills | Status: AC
Start: 2022-12-27 — End: 2022-12-27

## 2022-12-27 NOTE — Assessment & Plan Note (Signed)
Reflux symptoms are minimal on current therapy - protonix. No red flag signs such as weight loss, n/v, melena

## 2022-12-27 NOTE — Assessment & Plan Note (Addendum)
Long standing chronic complaint No benefit from melatonin, Benadryl, Trazodone Trial of Mirtazapine

## 2022-12-27 NOTE — Assessment & Plan Note (Addendum)
Normal exam with stable BP on lisinopril, hctz and metoprolol. No concerns or side effects to current medication. No change in regimen; continue low sodium diet.

## 2022-12-27 NOTE — Assessment & Plan Note (Signed)
LDL is  Lab Results  Component Value Date   LDLCALC 59 02/28/2016   Currently being treated with simvastatin with good compliance and no concerns.

## 2022-12-27 NOTE — Assessment & Plan Note (Signed)
Severe OA with bone on bone.   Has seen ORtho and had injections; still limiting activities

## 2022-12-27 NOTE — Progress Notes (Signed)
Date:  12/27/2022   Name:  Carolyn Lowe   DOB:  Jul 02, 1950   MRN:  [id number]   Chief Complaint: New Patient (Initial Visit) (Wants shingles Rx for pharmacy. Wants pneumonia vaccine today.), Insomnia (Patient takes 2-4 hours to fall asleep. Patient has struggled with this for years. Tried over the counter drugs like melatonin and advil PM, and then trazodone. None of this helped. Trazodone caused her to be fatigued the next day.), and nasal drainage (Clear nasal drainage from both nostrils during and after eating every single time. Started about a year ago.)  Insomnia Primary symptoms: sleep disturbance, difficulty falling asleep.   Episode onset: many years ago. The problem occurs nightly. The problem is unchanged. Treatments tried: melatonin, zquil, trazodone. PMH includes: depression.   Hypertension Pertinent negatives include no chest pain, headaches or shortness of breath. Past treatments include beta blockers, diuretics and ACE inhibitors. The current treatment provides significant improvement. There is no history of kidney disease, CAD/MI or CVA.  Hyperlipidemia This is a chronic problem. The problem is controlled. Pertinent negatives include no chest pain, focal sensory loss, focal weakness, myalgias or shortness of breath. Current antihyperlipidemic treatment includes statins. The current treatment provides moderate improvement of lipids.  Gastroesophageal Reflux She complains of heartburn. She reports no abdominal pain, no chest pain or no sore throat. This is a recurrent problem. The problem occurs occasionally. Pertinent negatives include no fatigue. She has tried a PPI for the symptoms. The treatment provided significant relief.  Depression        This is a chronic problem.The problem is unchanged.  Associated symptoms include decreased concentration (had fam hx dementia) and insomnia.  Associated symptoms include no fatigue, no myalgias and no headaches.  Treatments tried:  bupropion.  Compliance with treatment is good.  Previous treatment provided moderate relief. Rhinorrhea - during and after eating has clear drainage from both nostrils.    Review of Systems  Constitutional:  Negative for chills, fatigue and fever.  HENT:  Positive for rhinorrhea. Negative for sore throat.   Respiratory:  Negative for chest tightness and shortness of breath.   Cardiovascular:  Negative for chest pain.  Gastrointestinal:  Positive for heartburn. Negative for abdominal pain, constipation and diarrhea.  Musculoskeletal:  Positive for arthralgias (esp both knees L>R). Negative for myalgias.  Neurological:  Negative for dizziness, focal weakness and headaches.  Psychiatric/Behavioral:  Positive for decreased concentration (had fam hx dementia), depression and sleep disturbance. Negative for dysphoric mood. The patient has insomnia. The patient is not nervous/anxious.     Immunization History  Administered Date(s) Administered   Influenza-Unspecified 12/18/2020, 11/27/2022   PNEUMOCOCCAL CONJUGATE-20 12/27/2022   Pfizer(Comirnaty)Fall Seasonal Vaccine 12 years and older 04/25/2019, 11/27/2022   Pneumococcal Conjugate-13 03/29/2017   Pneumococcal Polysaccharide-23 01/24/2006   Tdap 04/11/2018    Lab Results  Component Value Date   NA 142 04/08/2022   K 5.5 (A) 04/08/2022   CO2 29 (A) 04/08/2022   GLUCOSE 83 02/27/2016   BUN 25 (A) 04/08/2022   CREATININE 1.1 04/08/2022   CALCIUM 10.5 04/08/2022   EGFR 57 04/08/2022   GFRNONAA 51 (L) 02/27/2016   Lab Results  Component Value Date   CHOL 268 (A) 04/08/2022   HDL 54 04/08/2022   LDLCALC 161 04/08/2022   TRIG 264 (A) 04/08/2022   CHOLHDL 3.2 02/28/2016   No results found for: "TSH" No results found for: "HGBA1C" Lab Results  Component Value Date   WBC 8.1 04/08/2022   HGB  14.9 04/08/2022   HCT 44 04/08/2022   MCV 95.3 02/27/2016   PLT 363 04/08/2022   Lab Results  Component Value Date   ALT 70 (A)  04/08/2022   AST 28 04/08/2022   ALKPHOS 66 04/08/2022   BILITOT 0.5 02/27/2016   No results found for: "25OHVITD2", "25OHVITD3", "VD25OH"   Patient Active Problem List   Diagnosis Date Noted   History of right breast cancer 12/27/2022   Major depression single episode, in partial remission (HCC) 12/27/2022   GERD (gastroesophageal reflux disease) 12/27/2022   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   MCI (mild cognitive impairment) 12/27/2022   Degenerative arthritis of knee, bilateral 12/12/2018   Arthritis of midfoot 04/16/2018   Chest pain with high risk for cardiac etiology 02/27/2016   Essential hypertension 02/27/2016   Hyperlipidemia 02/27/2016   History of left breast cancer 01/14/2014   Insomnia 02/03/2013   Vulvar lesion 03/20/2005   S/P abdominal hysterectomy 03/20/1982    No Known Allergies  Past Surgical History:  Procedure Laterality Date   ABDOMINAL HYSTERECTOMY  1984   BREAST LUMPECTOMY Left 2010   BREAST LUMPECTOMY Right 2021   CARPAL TUNNEL RELEASE Right 2013   CHOLECYSTECTOMY  1999   INCISIONAL HERNIA REPAIR N/A 10/22/2013   Procedure: LAPAROSCOPIC INCISIONAL HERNIA;  Surgeon: Shelly Rubenstein, MD;  Location: [facility];  Service: General;  Laterality: N/A;   INSERTION OF MESH N/A 10/22/2013   Procedure: INSERTION OF MESH;  Surgeon: Shelly Rubenstein, MD;  Location: [facility];  Service: General;  Laterality: N/A;   NASAL SINUS SURGERY  1987   right knee arthroscopy Right 2005    Social History   Tobacco Use   Smoking status: Never   Smokeless tobacco: Never  Vaping Use   Vaping status: Never Used  Substance Use Topics   Alcohol use: Yes    Comment: rare   Drug use: No     Medication list has been reviewed and updated.  Current Meds  Medication Sig   anastrozole (ARIMIDEX) 1 MG tablet Take 1 mg by mouth daily.   Apple Cider Vinegar 188 MG CAPS Take by mouth.   aspirin EC 81 MG tablet Take 81 mg by mouth daily.   azelastine (ASTELIN) 0.1 % nasal  spray Place 2 sprays into both nostrils 2 (two) times daily. Use in each nostril as directed   buPROPion (WELLBUTRIN XL) 150 MG 24 hr tablet Take 150 mg by mouth daily.   donepezil (ARICEPT) 5 MG tablet Take 5 mg by mouth at bedtime.   hydrochlorothiazide (HYDRODIURIL) 12.5 MG tablet    lisinopril (PRINIVIL,ZESTRIL) 30 MG tablet    metoprolol succinate (TOPROL-XL) 25 MG 24 hr tablet Take 25 mg by mouth daily.   mirtazapine (REMERON) 15 MG tablet Take 1 tablet (15 mg total) by mouth at bedtime.   Misc Natural Products (TART CHERRY ADVANCED PO) Take by mouth.   Multiple Vitamin (MULTIVITAMIN WITH MINERALS) TABS tablet Take 1 tablet by mouth daily.   pantoprazole (PROTONIX) 40 MG tablet Take 40 mg by mouth at bedtime.    simvastatin (ZOCOR) 20 MG tablet Take 40 mg by mouth at bedtime.    vitamin E 1000 UNIT capsule Take 1,000 Units by mouth daily.   Zoster Vaccine Adjuvanted [facility]) injection Inject 0.5 mLs into the muscle once for 1 dose.       12/27/2022    2:06 PM  GAD 7 : Generalized Anxiety Score  Nervous, Anxious, on Edge 0  Control/stop worrying 0  Worry too much - different things 0  Trouble relaxing 0  Restless 0  Easily annoyed or irritable 0  Afraid - awful might happen 0  Total GAD 7 Score 0  Anxiety Difficulty Not difficult at all       12/27/2022    2:06 PM  Depression screen PHQ 2/9  Decreased Interest 0  Down, Depressed, Hopeless 0  PHQ - 2 Score 0  Altered sleeping 3  Tired, decreased energy 3  Change in appetite 0  Feeling bad or failure about yourself  0  Trouble concentrating 0  Moving slowly or fidgety/restless 0  Suicidal thoughts 0  PHQ-9 Score 6  Difficult doing work/chores Not difficult at all    BP Readings from Last 3 Encounters:  12/27/22 112/76  12/03/18 140/88  06/24/18 120/72    Physical Exam Vitals and nursing note reviewed.  Constitutional:      General: She is not in acute distress.    Appearance: Normal appearance. She is  well-developed.  HENT:     Head: Normocephalic and atraumatic.  Neck:     Vascular: No carotid bruit.  Cardiovascular:     Rate and Rhythm: Normal rate and regular rhythm.     Pulses: Normal pulses.  Pulmonary:     Effort: Pulmonary effort is normal. No respiratory distress.     Breath sounds: No wheezing or rhonchi.  Musculoskeletal:     Cervical back: Normal range of motion.     Right lower leg: No edema.     Left lower leg: No edema.  Lymphadenopathy:     Cervical: No cervical adenopathy.  Skin:    General: Skin is warm and dry.     Capillary Refill: Capillary refill takes less than 2 seconds.     Findings: No rash.  Neurological:     General: No focal deficit present.     Mental Status: She is alert and oriented to person, place, and time.  Psychiatric:        Mood and Affect: Mood normal.        Behavior: Behavior normal.     Wt Readings from Last 3 Encounters:  12/27/22 126 lb (57.2 kg)  12/03/18 137 lb (62.1 kg)  06/24/18 140 lb (63.5 kg)    BP 112/76   Pulse (!) 47   Ht 5\' 2"  (1.575 m)   Wt 126 lb (57.2 kg)   SpO2 94%   BMI 23.05 kg/m   Assessment and Plan:  Problem List Items Addressed This Visit       Unprioritized   Degenerative arthritis of knee, bilateral    Severe OA with bone on bone.   Has seen ORtho and had injections; still limiting activities      Essential hypertension    Normal exam with stable BP on lisinopril, hctz and metoprolol. No concerns or side effects to current medication. No change in regimen; continue low sodium diet.       GERD (gastroesophageal reflux disease)    Reflux symptoms are minimal on current therapy - protonix. No red flag signs such as weight loss, n/v, melena       History of left breast cancer   History of right breast cancer   Hyperlipidemia    LDL is  Lab Results  Component Value Date   LDLCALC 59 02/28/2016   Currently being treated with simvastatin with good compliance and no concerns.        Insomnia - Primary    Long  standing chronic complaint No benefit from melatonin, Benadryl, Trazodone Trial of Mirtazapine      Relevant Medications   mirtazapine (REMERON) 15 MG tablet   Major depression single episode, in partial remission (HCC)    On Effexor for years but changed to Bupropion about 18 mo ago. Doing fairly well at this time. Will add Mirtazapine to help with sleep      Relevant Medications   mirtazapine (REMERON) 15 MG tablet   Spasmodic rhinorrhea   Relevant Medications   azelastine (ASTELIN) 0.1 % nasal spray   Other Visit Diagnoses     Need for shingles vaccine       Relevant Medications   Zoster Vaccine Adjuvanted [facility]) injection   Need for vaccination for pneumococcus       Relevant Orders   Pneumococcal conjugate vaccine 20-valent (Completed)       Return in about 6 weeks (around 02/07/2023) for insomnia, rhinnorhea.    Reubin Milan, MD [facility] [place]

## 2022-12-27 NOTE — Assessment & Plan Note (Addendum)
On Effexor for years but changed to Bupropion about 18 mo ago. Doing fairly well at this time. Will add Mirtazapine to help with sleep

## 2023-01-18 ENCOUNTER — Other Ambulatory Visit: Payer: Self-pay | Admitting: Internal Medicine

## 2023-01-18 DIAGNOSIS — F324 Major depressive disorder, single episode, in partial remission: Secondary | ICD-10-CM

## 2023-01-18 DIAGNOSIS — F5101 Primary insomnia: Secondary | ICD-10-CM

## 2023-02-07 ENCOUNTER — Encounter: Payer: Self-pay | Admitting: Internal Medicine

## 2023-02-07 ENCOUNTER — Ambulatory Visit (INDEPENDENT_AMBULATORY_CARE_PROVIDER_SITE_OTHER): Payer: Medicare HMO | Admitting: Internal Medicine

## 2023-02-07 VITALS — BP 122/72 | HR 53 | Ht 62.0 in | Wt 132.0 lb

## 2023-02-07 DIAGNOSIS — E782 Mixed hyperlipidemia: Secondary | ICD-10-CM | POA: Diagnosis not present

## 2023-02-07 DIAGNOSIS — F324 Major depressive disorder, single episode, in partial remission: Secondary | ICD-10-CM | POA: Diagnosis not present

## 2023-02-07 DIAGNOSIS — F5101 Primary insomnia: Secondary | ICD-10-CM

## 2023-02-07 DIAGNOSIS — I10 Essential (primary) hypertension: Secondary | ICD-10-CM

## 2023-02-07 NOTE — Assessment & Plan Note (Signed)
On statin medications - due for follow up labs in the near future Return for fasting appt

## 2023-02-07 NOTE — Assessment & Plan Note (Signed)
Clinically stable on Bupropion and Mirtazapine with good response, No SI or HI reported. No change in management at this time.

## 2023-02-07 NOTE — Progress Notes (Signed)
Date:  02/07/2023   Name:  Carolyn Lowe   DOB:  09-18-1950   MRN:  [id number]   Chief Complaint: Insomnia (Started 2 years ago. On an average night patient says she sleeps about 6 hours. Patient wakes up to go urinate. )  Insomnia Primary symptoms: sleep disturbance (much improved).   The current episode started more than one year. The onset quality is undetermined. The problem has been gradually improving since onset. Types of beverages you drink: coffee. Treatments tried: Remeron has been helpful. The treatment provided significant relief. PMH includes: depression.   Hypertension This is a chronic problem. The problem is controlled. Pertinent negatives include no chest pain, headaches, palpitations or shortness of breath. Past treatments include diuretics, ACE inhibitors and beta blockers. The current treatment provides significant improvement. There is no history of kidney disease, CAD/MI or CVA.  Depression        This is a chronic problem.  The problem has been gradually improving since onset.  Associated symptoms include insomnia.  Associated symptoms include no fatigue and no headaches. Hyperlipidemia This is a chronic problem. Pertinent negatives include no chest pain or shortness of breath. Current antihyperlipidemic treatment includes statins.    Review of Systems  Constitutional:  Negative for fatigue and unexpected weight change.  HENT:  Negative for nosebleeds.   Eyes:  Negative for visual disturbance.  Respiratory:  Negative for cough, chest tightness, shortness of breath and wheezing.   Cardiovascular:  Negative for chest pain, palpitations and leg swelling.  Gastrointestinal:  Negative for abdominal pain, constipation and diarrhea.  Neurological:  Negative for dizziness, weakness, light-headedness and headaches.  Psychiatric/Behavioral:  Positive for depression and sleep disturbance (much improved). Negative for dysphoric mood. The patient has insomnia. The patient  is not nervous/anxious.      Lab Results  Component Value Date   NA 142 04/08/2022   K 5.5 (A) 04/08/2022   CO2 29 (A) 04/08/2022   GLUCOSE 83 02/27/2016   BUN 25 (A) 04/08/2022   CREATININE 1.1 04/08/2022   CALCIUM 10.5 04/08/2022   EGFR 57 04/08/2022   GFRNONAA 51 (L) 02/27/2016   Lab Results  Component Value Date   CHOL 268 (A) 04/08/2022   HDL 54 04/08/2022   LDLCALC 161 04/08/2022   TRIG 264 (A) 04/08/2022   CHOLHDL 3.2 02/28/2016   No results found for: "TSH" No results found for: "HGBA1C" Lab Results  Component Value Date   WBC 8.1 04/08/2022   HGB 14.9 04/08/2022   HCT 44 04/08/2022   MCV 95.3 02/27/2016   PLT 363 04/08/2022   Lab Results  Component Value Date   ALT 70 (A) 04/08/2022   AST 28 04/08/2022   ALKPHOS 66 04/08/2022   BILITOT 0.5 02/27/2016   No results found for: "25OHVITD2", "25OHVITD3", "VD25OH"   Patient Active Problem List   Diagnosis Date Noted   History of right breast cancer 12/27/2022   Major depression single episode, in partial remission (HCC) 12/27/2022   GERD (gastroesophageal reflux disease) 12/27/2022   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   MCI (mild cognitive impairment) 12/27/2022   Degenerative arthritis of knee, bilateral 12/12/2018   Arthritis of midfoot 04/16/2018   Chest pain with high risk for cardiac etiology 02/27/2016   Essential hypertension 02/27/2016   Hyperlipidemia 02/27/2016   History of left breast cancer 01/14/2014   Insomnia 02/03/2013   Vulvar lesion 03/20/2005   S/P abdominal hysterectomy 03/20/1982    No Known Allergies  Past Surgical History:  Procedure Laterality Date   ABDOMINAL HYSTERECTOMY  1984   BREAST LUMPECTOMY Left 2010   BREAST LUMPECTOMY Right 2021   CARPAL TUNNEL RELEASE Right 2013   CHOLECYSTECTOMY  1999   INCISIONAL HERNIA REPAIR N/A 10/22/2013   Procedure: LAPAROSCOPIC INCISIONAL HERNIA;  Surgeon: Shelly Rubenstein, MD;  Location: [facility];  Service: General;  Laterality: N/A;    INSERTION OF MESH N/A 10/22/2013   Procedure: INSERTION OF MESH;  Surgeon: Shelly Rubenstein, MD;  Location: [facility];  Service: General;  Laterality: N/A;   NASAL SINUS SURGERY  1987   right knee arthroscopy Right 2005    Social History   Tobacco Use   Smoking status: Never   Smokeless tobacco: Never  Vaping Use   Vaping status: Never Used  Substance Use Topics   Alcohol use: Yes    Comment: rare   Drug use: No     Medication list has been reviewed and updated.  Current Meds  Medication Sig   anastrozole (ARIMIDEX) 1 MG tablet Take 1 mg by mouth daily.   Apple Cider Vinegar 188 MG CAPS Take by mouth.   aspirin EC 81 MG tablet Take 81 mg by mouth daily.   azelastine (ASTELIN) 0.1 % nasal spray Place 2 sprays into both nostrils 2 (two) times daily. Use in each nostril as directed   buPROPion (WELLBUTRIN XL) 150 MG 24 hr tablet Take 150 mg by mouth daily.   donepezil (ARICEPT) 5 MG tablet Take 5 mg by mouth at bedtime.   hydrochlorothiazide (HYDRODIURIL) 12.5 MG tablet    lisinopril (PRINIVIL,ZESTRIL) 30 MG tablet    meloxicam (MOBIC) 7.5 MG tablet Take 7.5 mg by mouth daily.   metoprolol succinate (TOPROL-XL) 25 MG 24 hr tablet Take 25 mg by mouth daily.   mirtazapine (REMERON) 15 MG tablet TAKE 1 TABLET BY MOUTH EVERYDAY AT BEDTIME   Misc Natural Products (TART CHERRY ADVANCED PO) Take by mouth.   Multiple Vitamin (MULTIVITAMIN WITH MINERALS) TABS tablet Take 1 tablet by mouth daily.   pantoprazole (PROTONIX) 40 MG tablet Take 40 mg by mouth at bedtime.    simvastatin (ZOCOR) 20 MG tablet Take 40 mg by mouth at bedtime.    vitamin E 1000 UNIT capsule Take 1,000 Units by mouth daily.       02/07/2023    3:23 PM 12/27/2022    2:06 PM  GAD 7 : Generalized Anxiety Score  Nervous, Anxious, on Edge 0 0  Control/stop worrying 0 0  Worry too much - different things 0 0  Trouble relaxing 0 0  Restless 0 0  Easily annoyed or irritable 0 0  Afraid - awful might happen 0 0   Total GAD 7 Score 0 0  Anxiety Difficulty Not difficult at all Not difficult at all       02/07/2023    3:23 PM 12/27/2022    2:06 PM  Depression screen PHQ 2/9  Decreased Interest 0 0  Down, Depressed, Hopeless 0 0  PHQ - 2 Score 0 0  Altered sleeping 0 3  Tired, decreased energy 0 3  Change in appetite 0 0  Feeling bad or failure about yourself  0 0  Trouble concentrating 0 0  Moving slowly or fidgety/restless 0 0  Suicidal thoughts 0 0  PHQ-9 Score 0 6  Difficult doing work/chores Not difficult at all Not difficult at all    BP Readings from Last 3 Encounters:  02/07/23 122/72  12/27/22 112/76  12/03/18 140/88  Physical Exam Vitals and nursing note reviewed.  Constitutional:      General: She is not in acute distress.    Appearance: Normal appearance. She is well-developed.  HENT:     Head: Normocephalic and atraumatic.  Neck:     Vascular: No carotid bruit.  Cardiovascular:     Rate and Rhythm: Normal rate and regular rhythm.  Pulmonary:     Effort: Pulmonary effort is normal. No respiratory distress.     Breath sounds: No wheezing or rhonchi.  Musculoskeletal:     Cervical back: Normal range of motion.     Right lower leg: No edema.     Left lower leg: No edema.  Lymphadenopathy:     Cervical: No cervical adenopathy.  Skin:    General: Skin is warm and dry.     Findings: No rash.  Neurological:     General: No focal deficit present.     Mental Status: She is alert and oriented to person, place, and time.  Psychiatric:        Mood and Affect: Mood normal.        Behavior: Behavior normal.     Wt Readings from Last 3 Encounters:  02/07/23 132 lb (59.9 kg)  12/27/22 126 lb (57.2 kg)  12/03/18 137 lb (62.1 kg)    BP 122/72   Pulse (!) 53   Ht 5\' 2"  (1.575 m)   Wt 132 lb (59.9 kg)   SpO2 97%   BMI 24.14 kg/m   Assessment and Plan:  Problem List Items Addressed This Visit       Unprioritized   Essential hypertension - Primary     Controlled BP with normal exam. Current regimen is hydrochlorothiazide, metoprolol and lisinopril. Will continue same medications; encourage continued reduced sodium diet.       Hyperlipidemia    On statin medications - due for follow up labs in the near future Return for fasting appt      Insomnia    Sleep and mood much improved on Mirtazapine. Will continue current regimen and refill medications.      Major depression single episode, in partial remission (HCC)    Clinically stable on Bupropion and Mirtazapine with good response, No SI or HI reported. No change in management at this time.        Return in about 1 month (around 03/09/2023) for HTN,lipids.    Reubin Milan, MD [facility] [place]

## 2023-02-07 NOTE — Assessment & Plan Note (Signed)
Sleep and mood much improved on Mirtazapine. Will continue current regimen and refill medications.

## 2023-02-07 NOTE — Assessment & Plan Note (Signed)
Controlled BP with normal exam. Current regimen is hydrochlorothiazide, metoprolol and lisinopril. Will continue same medications; encourage continued reduced sodium diet.

## 2023-03-09 ENCOUNTER — Other Ambulatory Visit: Payer: Self-pay | Admitting: Internal Medicine

## 2023-03-09 ENCOUNTER — Ambulatory Visit (INDEPENDENT_AMBULATORY_CARE_PROVIDER_SITE_OTHER): Payer: Medicare HMO | Admitting: Internal Medicine

## 2023-03-09 ENCOUNTER — Encounter: Payer: Self-pay | Admitting: Internal Medicine

## 2023-03-09 VITALS — BP 124/60 | HR 64 | Ht 62.0 in | Wt 129.0 lb

## 2023-03-09 DIAGNOSIS — E782 Mixed hyperlipidemia: Secondary | ICD-10-CM

## 2023-03-09 DIAGNOSIS — I10 Essential (primary) hypertension: Secondary | ICD-10-CM

## 2023-03-09 DIAGNOSIS — Z1159 Encounter for screening for other viral diseases: Secondary | ICD-10-CM

## 2023-03-09 DIAGNOSIS — F324 Major depressive disorder, single episode, in partial remission: Secondary | ICD-10-CM

## 2023-03-09 DIAGNOSIS — K219 Gastro-esophageal reflux disease without esophagitis: Secondary | ICD-10-CM

## 2023-03-09 MED ORDER — BUPROPION HCL ER (XL) 150 MG PO TB24: 150.0000 mg | ORAL_TABLET | Freq: Every day | ORAL | 1 refills | Status: DC

## 2023-03-09 MED ORDER — LISINOPRIL 30 MG PO TABS: 30.0000 mg | ORAL_TABLET | Freq: Every day | ORAL | 1 refills | Status: DC

## 2023-03-09 MED ORDER — METOPROLOL SUCCINATE ER 25 MG PO TB24: 25.0000 mg | ORAL_TABLET | Freq: Every day | ORAL | 1 refills | Status: DC

## 2023-03-09 MED ORDER — HYDROCHLOROTHIAZIDE 12.5 MG PO TABS: 12.5000 mg | ORAL_TABLET | Freq: Every day | ORAL | 1 refills | Status: DC

## 2023-03-09 MED ORDER — PANTOPRAZOLE SODIUM 40 MG PO TBEC: 40.0000 mg | DELAYED_RELEASE_TABLET | Freq: Every day | ORAL | 1 refills | Status: DC

## 2023-03-09 MED ORDER — SIMVASTATIN 20 MG PO TABS: 40.0000 mg | ORAL_TABLET | Freq: Every day | ORAL | 1 refills | Status: DC

## 2023-03-09 MED ORDER — DONEPEZIL HCL 5 MG PO TABS: 5.0000 mg | ORAL_TABLET | Freq: Every day | ORAL | 1 refills | Status: DC

## 2023-03-09 MED ORDER — SIMVASTATIN 40 MG PO TABS: 40.0000 mg | ORAL_TABLET | Freq: Every day | ORAL | 1 refills | Status: DC

## 2023-03-09 NOTE — Telephone Encounter (Signed)
Requested medication (s) are due for refill today - no  Requested medication (s) are on the active medication list -yes  Future visit scheduled -yes  Last refill: 03/09/23  Notes to clinic: Pharmacy comment: Alternative Requested:INS PAYS 1TAB/DAY;PLEASE CONSIDER THE STRENGHT CHANGE IF APPROPRIATE.   Requested Prescriptions  Pending Prescriptions Disp Refills   rosuvastatin (CRESTOR) 5 MG tablet [Pharmacy Med Name: ROSUVASTATIN CALCIUM 5 MG TAB]  0     Cardiovascular:  Antilipid - Statins 2 Failed - 03/09/2023  4:00 PM      Failed - Lipid Panel in normal range within the last 12 months    Cholesterol  Date Value Ref Range Status  04/08/2022 268 (A) 0 - 200 Final   LDL Cholesterol  Date Value Ref Range Status  04/08/2022 161  Final   HDL  Date Value Ref Range Status  04/08/2022 54 35 - 70 Final   Triglycerides  Date Value Ref Range Status  04/08/2022 264 (A) 40 - 160 Final         Passed - Cr in normal range and within 360 days    Creatinine  Date Value Ref Range Status  04/08/2022 1.1 0.5 - 1.1 Final  01/07/2014 1.0 0.6 - 1.1 mg/dL Final   Creatinine, Ser  Date Value Ref Range Status  02/27/2016 1.11 (H) 0.44 - 1.00 mg/dL Final         Passed - Patient is not pregnant      Passed - Valid encounter within last 12 months    Recent Outpatient Visits           Today Essential hypertension   [place] [facility] at [facility], Nyoka Cowden, MD   1 month ago Essential hypertension   [place] [facility] at [facility], Nyoka Cowden, MD   2 months ago Primary insomnia   [place] [facility] at [facility], Nyoka Cowden, MD       Future Appointments             In 5 months Reubin Milan, MD [facility] at [facility], [facility]               Requested Prescriptions  Pending Prescriptions Disp Refills   rosuvastatin  (CRESTOR) 5 MG tablet [Pharmacy Med Name: ROSUVASTATIN CALCIUM 5 MG TAB]  0     Cardiovascular:  Antilipid - Statins 2 Failed - 03/09/2023  4:00 PM      Failed - Lipid Panel in normal range within the last 12 months    Cholesterol  Date Value Ref Range Status  04/08/2022 268 (A) 0 - 200 Final   LDL Cholesterol  Date Value Ref Range Status  04/08/2022 161  Final   HDL  Date Value Ref Range Status  04/08/2022 54 35 - 70 Final   Triglycerides  Date Value Ref Range Status  04/08/2022 264 (A) 40 - 160 Final         Passed - Cr in normal range and within 360 days    Creatinine  Date Value Ref Range Status  04/08/2022 1.1 0.5 - 1.1 Final  01/07/2014 1.0 0.6 - 1.1 mg/dL Final   Creatinine, Ser  Date Value Ref Range Status  02/27/2016 1.11 (H) 0.44 - 1.00 mg/dL Final         Passed - Patient is not pregnant      Passed -  Valid encounter within last 12 months    Recent Outpatient Visits           Today Essential hypertension   [place] [facility] at [facility], Nyoka Cowden, MD   1 month ago Essential hypertension   [place] [facility] at [facility], Nyoka Cowden, MD   2 months ago Primary insomnia   [facility] at [facility], Nyoka Cowden, MD       Future Appointments             In 5 months Judithann Graves, Nyoka Cowden, MD [facility] Medicine at [facility], [facility]

## 2023-03-09 NOTE — Assessment & Plan Note (Addendum)
Doing well on Bupropion and Mirtazepine.

## 2023-03-09 NOTE — Assessment & Plan Note (Signed)
LDL is  Lab Results  Component Value Date   LDLCALC 161 04/08/2022   Current regimen is simvastatin.  Tolerating medications well without issues but LDL not at goal.

## 2023-03-09 NOTE — Assessment & Plan Note (Signed)
BP well controlled. Continue lisinopril, hydrochlorothiazide and metoprolol.

## 2023-03-09 NOTE — Progress Notes (Signed)
Date:  03/09/2023   Name:  Carolyn Lowe   DOB:  Dec 02, 1950   MRN:  [id number]   Chief Complaint: Hyperlipidemia and Hypertension  Hypertension This is a chronic problem. The problem is controlled. Pertinent negatives include no chest pain, headaches, palpitations or shortness of breath.  Hyperlipidemia This is a chronic problem. Recent lipid tests were reviewed and are high. Pertinent negatives include no chest pain or shortness of breath. Current antihyperlipidemic treatment includes statins. The current treatment provides moderate improvement of lipids.  Depression        This is a chronic problem.The problem is unchanged.  Associated symptoms include no fatigue and no headaches.  Treatments tried: bupropion and remeron.   Review of Systems  Constitutional:  Negative for fatigue and unexpected weight change.  HENT:  Negative for nosebleeds.   Eyes:  Negative for visual disturbance.  Respiratory:  Negative for cough, chest tightness, shortness of breath and wheezing.   Cardiovascular:  Negative for chest pain, palpitations and leg swelling.  Gastrointestinal:  Negative for abdominal pain, constipation and diarrhea.  Neurological:  Negative for dizziness, weakness, light-headedness and headaches.  Psychiatric/Behavioral:  Positive for depression.      Lab Results  Component Value Date   NA 142 04/08/2022   K 5.5 (A) 04/08/2022   CO2 29 (A) 04/08/2022   GLUCOSE 83 02/27/2016   BUN 25 (A) 04/08/2022   CREATININE 1.1 04/08/2022   CALCIUM 10.5 04/08/2022   EGFR 57 04/08/2022   GFRNONAA 51 (L) 02/27/2016   Lab Results  Component Value Date   CHOL 268 (A) 04/08/2022   HDL 54 04/08/2022   LDLCALC 161 04/08/2022   TRIG 264 (A) 04/08/2022   CHOLHDL 3.2 02/28/2016   No results found for: "TSH" No results found for: "HGBA1C" Lab Results  Component Value Date   WBC 8.1 04/08/2022   HGB 14.9 04/08/2022   HCT 44 04/08/2022   MCV 95.3 02/27/2016   PLT 363 04/08/2022    Lab Results  Component Value Date   ALT 70 (A) 04/08/2022   AST 28 04/08/2022   ALKPHOS 66 04/08/2022   BILITOT 0.5 02/27/2016   No results found for: "25OHVITD2", "25OHVITD3", "VD25OH"   Patient Active Problem List   Diagnosis Date Noted   History of right breast cancer 12/27/2022   Major depression single episode, in partial remission (HCC) 12/27/2022   GERD (gastroesophageal reflux disease) 12/27/2022   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   MCI (mild cognitive impairment) 12/27/2022   Degenerative arthritis of knee, bilateral 12/12/2018   Arthritis of midfoot 04/16/2018   Chest pain with high risk for cardiac etiology 02/27/2016   Essential hypertension 02/27/2016   Mixed hyperlipidemia 02/27/2016   History of left breast cancer 01/14/2014   Insomnia 02/03/2013   Vulvar lesion 03/20/2005   S/P abdominal hysterectomy 03/20/1982    No Known Allergies  Past Surgical History:  Procedure Laterality Date   ABDOMINAL HYSTERECTOMY  1984   BREAST LUMPECTOMY Left 2010   BREAST LUMPECTOMY Right 2021   CARPAL TUNNEL RELEASE Right 2013   CHOLECYSTECTOMY  1999   INCISIONAL HERNIA REPAIR N/A 10/22/2013   Procedure: LAPAROSCOPIC INCISIONAL HERNIA;  Surgeon: Shelly Rubenstein, MD;  Location: [facility];  Service: General;  Laterality: N/A;   INSERTION OF MESH N/A 10/22/2013   Procedure: INSERTION OF MESH;  Surgeon: Shelly Rubenstein, MD;  Location: [facility];  Service: General;  Laterality: N/A;   NASAL SINUS SURGERY  1987   right knee arthroscopy  Right 2005    Social History   Tobacco Use   Smoking status: Never   Smokeless tobacco: Never  Vaping Use   Vaping status: Never Used  Substance Use Topics   Alcohol use: Yes    Comment: rare   Drug use: No     Medication list has been reviewed and updated.  Current Meds  Medication Sig   anastrozole (ARIMIDEX) 1 MG tablet Take 1 mg by mouth daily.   Apple Cider Vinegar 188 MG CAPS Take by mouth.   aspirin EC 81 MG tablet Take  81 mg by mouth daily.   azelastine (ASTELIN) 0.1 % nasal spray Place 2 sprays into both nostrils 2 (two) times daily. Use in each nostril as directed   meloxicam (MOBIC) 7.5 MG tablet Take 7.5 mg by mouth daily.   mirtazapine (REMERON) 15 MG tablet TAKE 1 TABLET BY MOUTH EVERYDAY AT BEDTIME   Misc Natural Products (TART CHERRY ADVANCED PO) Take by mouth.   Multiple Vitamin (MULTIVITAMIN WITH MINERALS) TABS tablet Take 1 tablet by mouth daily.   vitamin E 1000 UNIT capsule Take 1,000 Units by mouth daily.   [DISCONTINUED] buPROPion (WELLBUTRIN XL) 150 MG 24 hr tablet Take 150 mg by mouth daily.   [DISCONTINUED] donepezil (ARICEPT) 5 MG tablet Take 5 mg by mouth at bedtime.   [DISCONTINUED] hydrochlorothiazide (HYDRODIURIL) 12.5 MG tablet    [DISCONTINUED] lisinopril (PRINIVIL,ZESTRIL) 30 MG tablet    [DISCONTINUED] metoprolol succinate (TOPROL-XL) 25 MG 24 hr tablet Take 25 mg by mouth daily.   [DISCONTINUED] pantoprazole (PROTONIX) 40 MG tablet Take 40 mg by mouth at bedtime.    [DISCONTINUED] simvastatin (ZOCOR) 20 MG tablet Take 40 mg by mouth at bedtime.        03/09/2023    9:10 AM 02/07/2023    3:23 PM 12/27/2022    2:06 PM  GAD 7 : Generalized Anxiety Score  Nervous, Anxious, on Edge 0 0 0  Control/stop worrying 0 0 0  Worry too much - different things 0 0 0  Trouble relaxing 0 0 0  Restless 0 0 0  Easily annoyed or irritable 0 0 0  Afraid - awful might happen 0 0 0  Total GAD 7 Score 0 0 0  Anxiety Difficulty Not difficult at all Not difficult at all Not difficult at all       03/09/2023    9:10 AM 02/07/2023    3:23 PM 12/27/2022    2:06 PM  Depression screen PHQ 2/9  Decreased Interest 0 0 0  Down, Depressed, Hopeless 0 0 0  PHQ - 2 Score 0 0 0  Altered sleeping 0 0 3  Tired, decreased energy 0 0 3  Change in appetite 0 0 0  Feeling bad or failure about yourself  0 0 0  Trouble concentrating 0 0 0  Moving slowly or fidgety/restless 0 0 0  Suicidal thoughts 0 0 0   PHQ-9 Score 0 0 6  Difficult doing work/chores Not difficult at all Not difficult at all Not difficult at all    BP Readings from Last 3 Encounters:  03/09/23 124/60  02/07/23 122/72  12/27/22 112/76    Physical Exam Vitals and nursing note reviewed.  Constitutional:      General: She is not in acute distress.    Appearance: Normal appearance. She is well-developed.  HENT:     Head: Normocephalic and atraumatic.  Neck:     Vascular: No carotid bruit.  Cardiovascular:  Rate and Rhythm: Normal rate and regular rhythm.     Pulses: Normal pulses.     Heart sounds: No murmur heard. Pulmonary:     Effort: Pulmonary effort is normal. No respiratory distress.     Breath sounds: No wheezing or rhonchi.  Musculoskeletal:     Cervical back: Normal range of motion.     Right lower leg: No edema.     Left lower leg: No edema.  Lymphadenopathy:     Cervical: No cervical adenopathy.  Skin:    General: Skin is warm and dry.     Capillary Refill: Capillary refill takes less than 2 seconds.     Findings: No rash.  Neurological:     General: No focal deficit present.     Mental Status: She is alert and oriented to person, place, and time.  Psychiatric:        Mood and Affect: Mood normal.        Behavior: Behavior normal.     Wt Readings from Last 3 Encounters:  03/09/23 129 lb (58.5 kg)  02/07/23 132 lb (59.9 kg)  12/27/22 126 lb (57.2 kg)    BP 124/60   Pulse 64   Ht 5\' 2"  (1.575 m)   Wt 129 lb (58.5 kg)   SpO2 100%   BMI 23.59 kg/m   Assessment and Plan:  Problem List Items Addressed This Visit       Unprioritized   Essential hypertension - Primary   BP well controlled. Continue lisinopril, hydrochlorothiazide and metoprolol.      Relevant Medications   hydrochlorothiazide (HYDRODIURIL) 12.5 MG tablet   lisinopril (ZESTRIL) 30 MG tablet   metoprolol succinate (TOPROL-XL) 25 MG 24 hr tablet   simvastatin (ZOCOR) 20 MG tablet   Other Relevant Orders    CBC with Differential/Platelet   Comprehensive metabolic panel   Urinalysis, Routine w reflex microscopic   Mixed hyperlipidemia   LDL is  Lab Results  Component Value Date   LDLCALC 161 04/08/2022   Current regimen is simvastatin.  Tolerating medications well without issues but LDL not at goal.       Relevant Medications   hydrochlorothiazide (HYDRODIURIL) 12.5 MG tablet   lisinopril (ZESTRIL) 30 MG tablet   metoprolol succinate (TOPROL-XL) 25 MG 24 hr tablet   simvastatin (ZOCOR) 20 MG tablet   Other Relevant Orders   Comprehensive metabolic panel   Lipid panel   Major depression single episode, in partial remission (HCC)   Doing well on Bupropion and Mirtazepine.       Relevant Medications   buPROPion (WELLBUTRIN XL) 150 MG 24 hr tablet   GERD (gastroesophageal reflux disease)   Relevant Medications   pantoprazole (PROTONIX) 40 MG tablet   Other Visit Diagnoses       Need for hepatitis C screening test       Relevant Orders   Hepatitis C antibody       Return in about 6 months (around 09/07/2023) for HTN, Depression.    Reubin Milan, MD [facility] [place]

## 2023-03-10 LAB — CBC WITH DIFFERENTIAL/PLATELET
Basophils Absolute: 0.1 10*3/uL (ref 0.0–0.2)
Basos: 1 %
EOS (ABSOLUTE): 0.2 10*3/uL (ref 0.0–0.4)
Eos: 3 %
Hematocrit: 41.1 % (ref 34.0–46.6)
Hemoglobin: 13.4 g/dL (ref 11.1–15.9)
Immature Grans (Abs): 0 10*3/uL (ref 0.0–0.1)
Immature Granulocytes: 0 %
Lymphocytes Absolute: 1 10*3/uL (ref 0.7–3.1)
Lymphs: 19 %
MCH: 32.9 pg (ref 26.6–33.0)
MCHC: 32.6 g/dL (ref 31.5–35.7)
MCV: 101 fL — ABNORMAL HIGH (ref 79–97)
Monocytes Absolute: 0.6 10*3/uL (ref 0.1–0.9)
Monocytes: 11 %
Neutrophils Absolute: 3.6 10*3/uL (ref 1.4–7.0)
Neutrophils: 66 %
Platelets: 243 10*3/uL (ref 150–450)
RBC: 4.07 x10E6/uL (ref 3.77–5.28)
RDW: 12 % (ref 11.7–15.4)
WBC: 5.5 10*3/uL (ref 3.4–10.8)

## 2023-03-10 LAB — LIPID PANEL
Chol/HDL Ratio: 3.7 {ratio} (ref 0.0–4.4)
Cholesterol, Total: 200 mg/dL — ABNORMAL HIGH (ref 100–199)
HDL: 54 mg/dL (ref >39)
LDL Chol Calc (NIH): 111 mg/dL — ABNORMAL HIGH (ref 0–99)
Triglycerides: 200 mg/dL — ABNORMAL HIGH (ref 0–149)
VLDL Cholesterol Cal: 35 mg/dL (ref 5–40)

## 2023-03-10 LAB — MICROSCOPIC EXAMINATION
Bacteria, UA: NONE SEEN
Casts: NONE SEEN /[LPF]

## 2023-03-10 LAB — URINALYSIS, ROUTINE W REFLEX MICROSCOPIC
Bilirubin, UA: NEGATIVE
Glucose, UA: NEGATIVE
Ketones, UA: NEGATIVE
Nitrite, UA: NEGATIVE
RBC, UA: NEGATIVE
Specific Gravity, UA: 1.021 (ref 1.005–1.030)
Urobilinogen, Ur: 0.2 mg/dL (ref 0.2–1.0)
pH, UA: 7.5 (ref 5.0–7.5)

## 2023-03-10 LAB — COMPREHENSIVE METABOLIC PANEL
ALT: 19 [IU]/L (ref 0–32)
AST: 19 [IU]/L (ref 0–40)
Albumin: 4.4 g/dL (ref 3.8–4.8)
Alkaline Phosphatase: 49 [IU]/L (ref 44–121)
BUN/Creatinine Ratio: 15 (ref 12–28)
BUN: 19 mg/dL (ref 8–27)
Bilirubin Total: 0.4 mg/dL (ref 0.0–1.2)
CO2: 24 mmol/L (ref 20–29)
Calcium: 9.5 mg/dL (ref 8.7–10.3)
Chloride: 104 mmol/L (ref 96–106)
Creatinine, Ser: 1.26 mg/dL — ABNORMAL HIGH (ref 0.57–1.00)
Globulin, Total: 2.1 g/dL (ref 1.5–4.5)
Glucose: 91 mg/dL (ref 70–99)
Potassium: 5.1 mmol/L (ref 3.5–5.2)
Sodium: 141 mmol/L (ref 134–144)
Total Protein: 6.5 g/dL (ref 6.0–8.5)
eGFR: 46 mL/min/{1.73_m2} — ABNORMAL LOW (ref >59)

## 2023-03-10 LAB — HEPATITIS C ANTIBODY: Hep C Virus Ab: NONREACTIVE

## 2023-03-28 ENCOUNTER — Telehealth: Payer: Self-pay | Admitting: Internal Medicine

## 2023-03-28 NOTE — Telephone Encounter (Signed)
 Pt called in about med, donepezil (ARICEPT) 5 MG tablet , says was supposed to be increased to 10mg  for her to pick up at pharmacy. Please cb to discuss

## 2023-03-29 NOTE — Telephone Encounter (Signed)
 Please review.  KP

## 2023-03-30 ENCOUNTER — Other Ambulatory Visit: Payer: Self-pay | Admitting: Internal Medicine

## 2023-03-30 ENCOUNTER — Other Ambulatory Visit: Payer: Self-pay

## 2023-03-30 DIAGNOSIS — G3184 Mild cognitive impairment, so stated: Secondary | ICD-10-CM

## 2023-03-30 MED ORDER — DONEPEZIL HCL 10 MG PO TABS: 10.0000 mg | ORAL_TABLET | Freq: Every day | ORAL | 1 refills | Status: DC

## 2023-03-30 MED ORDER — DONEPEZIL HCL 5 MG PO TABS: 5.0000 mg | ORAL_TABLET | Freq: Every day | ORAL | 1 refills | Status: DC

## 2023-03-30 NOTE — Telephone Encounter (Signed)
 Called pt went over Dr. Jaclynn Guarneri message. Pt verbalized understanding and wanted a refill sent in. She did state that her last refill was 10 MG. Sent in 5 MG to pharmacy due to the fact the declined the 5 MG we sent in on 12/20.  KP

## 2023-04-04 ENCOUNTER — Encounter (HOSPITAL_BASED_OUTPATIENT_CLINIC_OR_DEPARTMENT_OTHER): Payer: Self-pay | Admitting: *Deleted

## 2023-04-04 ENCOUNTER — Emergency Department (HOSPITAL_BASED_OUTPATIENT_CLINIC_OR_DEPARTMENT_OTHER)
Admission: EM | Admit: 2023-04-04 | Discharge: 2023-04-04 | Disposition: A | Discharge status: Home / Self Care | Payer: Medicare HMO | Attending: Emergency Medicine | Admitting: Emergency Medicine

## 2023-04-04 ENCOUNTER — Telehealth: Payer: Self-pay

## 2023-04-04 ENCOUNTER — Telehealth: Payer: Medicare HMO | Admitting: Internal Medicine

## 2023-04-04 ENCOUNTER — Other Ambulatory Visit: Payer: Self-pay

## 2023-04-04 ENCOUNTER — Ambulatory Visit: Payer: Medicare HMO

## 2023-04-04 DIAGNOSIS — K529 Noninfective gastroenteritis and colitis, unspecified: Secondary | ICD-10-CM | POA: Diagnosis not present

## 2023-04-04 DIAGNOSIS — Z20822 Contact with and (suspected) exposure to covid-19: Secondary | ICD-10-CM | POA: Insufficient documentation

## 2023-04-04 DIAGNOSIS — Z79899 Other long term (current) drug therapy: Secondary | ICD-10-CM | POA: Insufficient documentation

## 2023-04-04 DIAGNOSIS — Z7982 Long term (current) use of aspirin: Secondary | ICD-10-CM | POA: Diagnosis not present

## 2023-04-04 DIAGNOSIS — I10 Essential (primary) hypertension: Secondary | ICD-10-CM | POA: Insufficient documentation

## 2023-04-04 DIAGNOSIS — E860 Dehydration: Secondary | ICD-10-CM | POA: Insufficient documentation

## 2023-04-04 DIAGNOSIS — R197 Diarrhea, unspecified: Secondary | ICD-10-CM | POA: Diagnosis present

## 2023-04-04 LAB — URINALYSIS, ROUTINE W REFLEX MICROSCOPIC
Bacteria, UA: NONE SEEN
Bilirubin Urine: NEGATIVE
Glucose, UA: NEGATIVE mg/dL
Hgb urine dipstick: NEGATIVE
Nitrite: NEGATIVE
Specific Gravity, Urine: 1.026 (ref 1.005–1.030)
pH: 5.5 (ref 5.0–8.0)

## 2023-04-04 LAB — COMPREHENSIVE METABOLIC PANEL
ALT: 15 U/L (ref 0–44)
AST: 18 U/L (ref 15–41)
Albumin: 4.9 g/dL (ref 3.5–5.0)
Alkaline Phosphatase: 41 U/L (ref 38–126)
Anion gap: 11 (ref 5–15)
BUN: 19 mg/dL (ref 8–23)
CO2: 25 mmol/L (ref 22–32)
Calcium: 10.2 mg/dL (ref 8.9–10.3)
Chloride: 103 mmol/L (ref 98–111)
Creatinine, Ser: 1.25 mg/dL — ABNORMAL HIGH (ref 0.44–1.00)
GFR, Estimated: 46 mL/min — ABNORMAL LOW (ref >60)
Glucose, Bld: 100 mg/dL — ABNORMAL HIGH (ref 70–99)
Potassium: 3.8 mmol/L (ref 3.5–5.1)
Sodium: 139 mmol/L (ref 135–145)
Total Bilirubin: 0.6 mg/dL (ref 0.0–1.2)
Total Protein: 8 g/dL (ref 6.5–8.1)

## 2023-04-04 LAB — RESP PANEL BY RT-PCR (RSV, FLU A&B, COVID)  RVPGX2
Influenza A by PCR: NEGATIVE
Influenza B by PCR: NEGATIVE
Resp Syncytial Virus by PCR: NEGATIVE
SARS Coronavirus 2 by RT PCR: NEGATIVE

## 2023-04-04 LAB — LIPASE, BLOOD: Lipase: 42 U/L (ref 11–51)

## 2023-04-04 LAB — CBC
HCT: 43.9 % (ref 36.0–46.0)
Hemoglobin: 15.2 g/dL — ABNORMAL HIGH (ref 12.0–15.0)
MCH: 32.8 pg (ref 26.0–34.0)
MCHC: 34.6 g/dL (ref 30.0–36.0)
MCV: 94.8 fL (ref 80.0–100.0)
Platelets: 291 10*3/uL (ref 150–400)
RBC: 4.63 MIL/uL (ref 3.87–5.11)
RDW: 12.2 % (ref 11.5–15.5)
WBC: 8.9 10*3/uL (ref 4.0–10.5)
nRBC: 0 % (ref 0.0–0.2)

## 2023-04-04 MED ORDER — SODIUM CHLORIDE 0.9 % IV BOLUS
1000.0000 mL | Freq: Once | INTRAVENOUS | Status: AC
  Administered 2023-04-04: 1000 mL via INTRAVENOUS

## 2023-04-04 MED ORDER — ONDANSETRON HCL 4 MG/2ML IJ SOLN
4.0000 mg | Freq: Once | INTRAMUSCULAR | Status: AC
  Administered 2023-04-04: 4 mg via INTRAVENOUS
  Filled 2023-04-04: qty 2

## 2023-04-04 MED ORDER — ONDANSETRON 4 MG PO TBDP: 4.0000 mg | ORAL_TABLET | Freq: Three times a day (TID) | ORAL | 0 refills | Status: DC | PRN

## 2023-04-04 NOTE — ED Triage Notes (Signed)
 Pt has been sick with GI bug since Friday.  Initially she had n/v/d and the nausea and vomiting resolved but pt continues to have diarrhea and reports that after any oral intake she has diarrhea, 6 episodes today.  Pt has had chills with this.  No abdominal pain. Pt had an e-visit and was told to come to ED

## 2023-04-04 NOTE — Telephone Encounter (Signed)
 Called patient to get her ready for her Virtual Visit with Dr Gala Jubilee. Patient said she has not ate in 2 days, she said she is very weak and shaking. She said she is having chronic diarrhea for 3 days now.   Spoke with Dr Gala Jubilee and she recommended the patient to go to the ER for fluids and treatment in person.   Patient agreed. Canceled today's virtual visit and patient will go to ER.  - Carolyn Lowe

## 2023-04-04 NOTE — ED Provider Notes (Signed)
 [place] EMERGENCY DEPARTMENT AT [facility] Provider Note   CSN: [id number] Arrival date & time: 04/04/23  1455     History  Chief Complaint  Patient presents with   Diarrhea    Carolyn Lowe is a 73 y.o. female.  73 year old female with a history of hypertension and hyperlipidemia presents emergency department nausea vomiting and diarrhea.  On Friday she started having a GI bug.  Has had copious amounts of nonbloody nonbilious emesis.  Also has had multiple episodes of nonbloody stool.  Was starting to feel better and then yesterday got worse again.  No abdominal pain.  No abdominal surgeries.  Has not been prescribed anything as an outpatient yet for this.  Had an e-visit who told her to come to the emergency department for IV fluids.       Home Medications Prior to Admission medications   Medication Sig Start Date End Date Taking? Authorizing Provider  ondansetron  (ZOFRAN -ODT) 4 MG disintegrating tablet Take 1 tablet (4 mg total) by mouth every 8 (eight) hours as needed for nausea or vomiting. 04/04/23  Yes Carolyn Basket, MD  anastrozole (ARIMIDEX) 1 MG tablet Take 1 mg by mouth daily. 11/06/19   [provider]  Apple Cider Vinegar 188 MG CAPS Take by mouth.    [provider]  aspirin  EC 81 MG tablet Take 81 mg by mouth daily.    [provider]  azelastine  (ASTELIN ) 0.1 % nasal spray Place 2 sprays into both nostrils 2 (two) times daily. Use in each nostril as directed 12/27/22   Carolyn Dixons, MD  buPROPion  (WELLBUTRIN  XL) 150 MG 24 hr tablet Take 1 tablet (150 mg total) by mouth daily. 03/09/23   Carolyn Dixons, MD  donepezil  (ARICEPT ) 10 MG tablet Take 1 tablet (10 mg total) by mouth at bedtime. 03/30/23   Carolyn Dixons, MD  hydrochlorothiazide  (HYDRODIURIL ) 12.5 MG tablet Take 1 tablet (12.5 mg total) by mouth daily. 03/09/23   Carolyn Dixons, MD  lisinopril  (ZESTRIL ) 30 MG tablet Take 1 tablet (30 mg total) by  mouth daily. 03/09/23   Carolyn Dixons, MD  meloxicam (MOBIC) 7.5 MG tablet Take 7.5 mg by mouth daily.    [provider]  metoprolol  succinate (TOPROL -XL) 25 MG 24 hr tablet Take 1 tablet (25 mg total) by mouth daily. 03/09/23   Carolyn Dixons, MD  mirtazapine  (REMERON ) 15 MG tablet TAKE 1 TABLET BY MOUTH EVERYDAY AT BEDTIME 01/18/23   Lowe, Carolyn H, MD  Misc Natural Products (TART CHERRY ADVANCED PO) Take by mouth.    [provider]  Multiple Vitamin (MULTIVITAMIN WITH MINERALS) TABS tablet Take 1 tablet by mouth daily.    [provider]  pantoprazole  (PROTONIX ) 40 MG tablet Take 1 tablet (40 mg total) by mouth at bedtime. 03/09/23   Carolyn Dixons, MD  simvastatin  (ZOCOR ) 40 MG tablet Take 1 tablet (40 mg total) by mouth at bedtime. 03/09/23   Carolyn Dixons, MD  vitamin E 1000 UNIT capsule Take 1,000 Units by mouth daily.    [provider]      Allergies    Patient has no known allergies.    Review of Systems   Review of Systems  Physical Exam Updated Vital Signs BP 127/68   Pulse 65   Temp 98.1 F (36.7 C) (Oral)   Resp 16   SpO2 100%  Physical Exam Vitals and nursing note reviewed.  Constitutional:  General: She is not in acute distress.    Appearance: She is well-developed.  HENT:     Head: Normocephalic and atraumatic.     Right Ear: External ear normal.     Left Ear: External ear normal.     Nose: Nose normal.  Eyes:     Extraocular Movements: Extraocular movements intact.     Conjunctiva/sclera: Conjunctivae normal.     Pupils: Pupils are equal, round, and reactive to light.  Abdominal:     General: Abdomen is flat. There is no distension.     Palpations: Abdomen is soft. There is no mass.     Tenderness: There is no abdominal tenderness. There is no right CVA tenderness, left CVA tenderness or guarding.  Musculoskeletal:     Cervical back: Normal range of motion and neck supple.  Skin:    General:  Skin is warm and dry.  Neurological:     Mental Status: She is alert and oriented to person, place, and time. Mental status is at baseline.  Psychiatric:        Mood and Affect: Mood normal.     ED Results / Procedures / Treatments   Labs (all labs ordered are listed, but only abnormal results are displayed) Labs Reviewed  COMPREHENSIVE METABOLIC PANEL - Abnormal; Notable for the following components:      Result Value   Glucose, Bld 100 (*)    Creatinine, Ser 1.25 (*)    GFR, Estimated 46 (*)    All other components within normal limits  CBC - Abnormal; Notable for the following components:   Hemoglobin 15.2 (*)    All other components within normal limits  URINALYSIS, ROUTINE W REFLEX MICROSCOPIC - Abnormal; Notable for the following components:   Ketones, ur TRACE (*)    Protein, ur TRACE (*)    Leukocytes,Ua TRACE (*)    All other components within normal limits  RESP PANEL BY RT-PCR (RSV, FLU A&B, COVID)  RVPGX2  C DIFFICILE QUICK SCREEN W PCR REFLEX    LIPASE, BLOOD    EKG None  Radiology No results found.  Procedures Procedures    Medications Ordered in ED Medications  sodium chloride  0.9 % bolus 1,000 mL (1,000 mLs Intravenous New Bag/Given 04/04/23 2030)  ondansetron  (ZOFRAN ) injection 4 mg (4 mg Intravenous Given 04/04/23 2029)    ED Course/ Medical Decision Making/ A&P                                 Medical Decision Making Amount and/or Complexity of Data Reviewed Labs: ordered.  Risk Prescription drug management.   Carolyn Lowe is a 73 y.o. female with comorbidities that complicate the patient evaluation including hypertension and hyperlipidemia presents emergency department nausea vomiting and diarrhea.   Initial Ddx:  Gastroenteritis, small bowel obstruction, ileus, C. difficile  MDM/Course:  Patient resents emergency department with nausea, vomiting, and diarrhea.  Not having any abdominal pain.  Was referred in for IV fluids.  On  exam no abdominal tenderness to palpation.  Is overall very well-appearing.  She had lab work drawn that does not show any significant electrolyte abnormalities.  Was tolerating p.o. after Zofran .  Was also given IV fluids for rehydration.  Upon re-evaluation was feeling much better.  Did have a C. difficile sent though she does not have any significant risk factors and I do suspect that this is from another infectious cause of diarrhea.  Since this will take several hours to come back patient was counseled to check her MyChart for the results.  Return precautions discussed prior to discharge and she was sent home with Zofran .  This patient presents to the ED for concern of complaints listed in HPI, this involves an extensive number of treatment options, and is a complaint that carries with it a high risk of complications and morbidity. Disposition including potential need for admission considered.   Dispo: DC Home. Return precautions discussed including, but not limited to, those listed in the AVS. Allowed pt time to ask questions which were answered fully prior to dc.  Additional history obtained from daughter Records reviewed Outpatient Clinic Notes The following labs were independently interpreted: Chemistry and show no acute abnormality I independently reviewed the following imaging with scope of interpretation limited to determining acute life threatening conditions related to emergency care: Chest x-ray and agree with the radiologist interpretation with the following exceptions: none I personally reviewed and interpreted cardiac monitoring: normal sinus rhythm  I personally reviewed and interpreted the pt's EKG: see above for interpretation  I have reviewed the patients home medications and made adjustments as needed Social Determinants of health:  Elderly  Portions of this note were generated with Scientist, clinical (histocompatibility and immunogenetics). Dictation errors may occur despite best attempts at proofreading.      Final Clinical Impression(s) / ED Diagnoses Final diagnoses:  Gastroenteritis  Dehydration    Rx / DC Orders ED Discharge Orders          Ordered    ondansetron  (ZOFRAN -ODT) 4 MG disintegrating tablet  Every 8 hours PRN        04/04/23 2155              Carolyn Basket, MD 04/04/23 2211

## 2023-04-04 NOTE — Discharge Instructions (Signed)
You were seen for your viral bug (gastroenteritis) in the emergency department.  At home, please take the Zofran for your nausea and vomiting. Please be sure to stay well-hydrated.  Follow-up with your primary doctor in 2-3 days regarding your visit.  Return immediately to the emergency department if you experience any of the following: fainting, abdominal pain, high fevers, or any other concerning symptoms.  Thank you for visiting our Emergency Department. It was a pleasure taking care of you today.

## 2023-04-04 NOTE — ED Notes (Signed)
 ED Provider at bedside.

## 2023-04-05 LAB — C DIFFICILE QUICK SCREEN W PCR REFLEX
C Diff antigen: NEGATIVE
C Diff interpretation: NOT DETECTED
C Diff toxin: NEGATIVE

## 2023-04-12 NOTE — Progress Notes (Signed)
Ordered to rule out infection

## 2023-04-22 ENCOUNTER — Other Ambulatory Visit: Payer: Self-pay | Admitting: Internal Medicine

## 2023-04-22 DIAGNOSIS — F324 Major depressive disorder, single episode, in partial remission: Secondary | ICD-10-CM

## 2023-04-22 DIAGNOSIS — F5101 Primary insomnia: Secondary | ICD-10-CM

## 2023-04-24 NOTE — Telephone Encounter (Signed)
 Requested Prescriptions  Pending Prescriptions Disp Refills   mirtazapine  (REMERON ) 15 MG tablet [Pharmacy Med Name: MIRTAZAPINE  15 MG TABLET] 90 tablet 1    Sig: TAKE 1 TABLET BY MOUTH EVERYDAY AT BEDTIME     Psychiatry: Antidepressants - mirtazapine  Passed - 04/24/2023  8:52 AM      Passed - Completed PHQ-2 or PHQ-9 in the last 360 days      Passed - Valid encounter within last 6 months    Recent Outpatient Visits           1 month ago Essential hypertension   [place] [facility], Leita DEL, MD   2 months ago Essential hypertension   [place] [facility] at [facility], Leita DEL, MD   3 months ago Primary insomnia   [facility] at [facility], Leita DEL, MD       Future Appointments             In 3 months Justus, Leita DEL, MD [facility] at [facility], [facility]

## 2023-07-20 ENCOUNTER — Ambulatory Visit
Admission: RE | Admit: 2023-07-20 | Discharge: 2023-07-20 | Disposition: A | Discharge status: Home / Self Care | Source: Ambulatory Visit | Attending: Family Medicine | Admitting: Family Medicine

## 2023-07-20 VITALS — BP 181/90 | HR 57 | Temp 98.6°F | Resp 14 | Ht 62.0 in | Wt 136.0 lb

## 2023-07-20 DIAGNOSIS — I10 Essential (primary) hypertension: Secondary | ICD-10-CM

## 2023-07-20 DIAGNOSIS — R220 Localized swelling, mass and lump, head: Secondary | ICD-10-CM | POA: Diagnosis not present

## 2023-07-20 DIAGNOSIS — H6121 Impacted cerumen, right ear: Secondary | ICD-10-CM | POA: Diagnosis not present

## 2023-07-20 MED ORDER — AMOXICILLIN-POT CLAVULANATE 875-125 MG PO TABS: 1.0000 | ORAL_TABLET | Freq: Two times a day (BID) | ORAL | 0 refills | Status: DC

## 2023-07-20 MED ORDER — CEFTRIAXONE SODIUM 1 G IJ SOLR
1.0000 g | Freq: Once | INTRAMUSCULAR | Status: AC
  Administered 2023-07-20: 1 g via INTRAMUSCULAR

## 2023-07-20 NOTE — ED Provider Notes (Signed)
 [facility]    CSN: [id number] Arrival date & time: 07/20/23  1331      History   Chief Complaint Chief Complaint  Patient presents with   Facial Swelling   Facial Pain    HPI MONIA GUZZARDI is a 73 y.o. female.   HPI  Liset presents for facial swelling with dental and facial pain that started 2 days ago. Has not been able to eat due to her dental pain. Endorses rhinorrhea, headache. Has lateral neck and right ear pain. No fever, cough, sneezing, vomiting or diarrhea.   Former smoker who quit about 20 years ago.    She did not take her blood pressure medication today. Denies needing any refills.     Past Medical History:  Diagnosis Date   Arthritis    Brain aneurysm    Resolved spontaneously- IT SEALED IT SELF - PT WAS IN HER EARLY 30'S    Breast cancer (HCC)    Colon polyp, hyperplastic    Depression    DEPRESSION RELATED TO HX BREAST CANCER/ ARIMEDEX SIDE EFFECTS- NO LONGER ON ARIMEDEX - AND DOING BETTER   GERD (gastroesophageal reflux disease)    Hypercholesterolemia    Hypertension    Personal history of radiation therapy    Right trigger finger     Patient Active Problem List   Diagnosis Date Noted   History of right breast cancer 12/27/2022   Major depression single episode, in partial remission (HCC) 12/27/2022   GERD (gastroesophageal reflux disease) 12/27/2022   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   MCI (mild cognitive impairment) 12/27/2022   Degenerative arthritis of knee, bilateral 12/12/2018   Arthritis of midfoot 04/16/2018   Chest pain with high risk for cardiac etiology 02/27/2016   Essential hypertension 02/27/2016   Mixed hyperlipidemia 02/27/2016   History of left breast cancer 01/14/2014   Insomnia 02/03/2013   Vulvar lesion 03/20/2005   S/P abdominal hysterectomy 03/20/1982    Past Surgical History:  Procedure Laterality Date   ABDOMINAL HYSTERECTOMY  1984   BREAST LUMPECTOMY Left 2010   BREAST LUMPECTOMY Right  2021   CARPAL TUNNEL RELEASE Right 2013   CHOLECYSTECTOMY  1999   INCISIONAL HERNIA REPAIR N/A 10/22/2013   Procedure: LAPAROSCOPIC INCISIONAL HERNIA;  Surgeon: Rogena Class, MD;  Location: [facility];  Service: General;  Laterality: N/A;   INSERTION OF MESH N/A 10/22/2013   Procedure: INSERTION OF MESH;  Surgeon: Rogena Class, MD;  Location: [facility];  Service: General;  Laterality: N/A;   NASAL SINUS SURGERY  1987   right knee arthroscopy Right 2005    OB History   No obstetric history on file.      Home Medications    Prior to Admission medications   Medication Sig Start Date End Date Taking? Authorizing Provider  amoxicillin-clavulanate (AUGMENTIN) 875-125 MG tablet Take 1 tablet by mouth every 12 (twelve) hours. 07/20/23  Yes Mikie Misner, DO  anastrozole (ARIMIDEX) 1 MG tablet Take 1 mg by mouth daily. 11/06/19   [provider]  Apple Cider Vinegar 188 MG CAPS Take by mouth.    [provider]  aspirin  EC 81 MG tablet Take 81 mg by mouth daily.    [provider]  azelastine  (ASTELIN ) 0.1 % nasal spray Place 2 sprays into both nostrils 2 (two) times daily. Use in each nostril as directed 12/27/22   Sheron Dixons, MD  buPROPion  (WELLBUTRIN  XL) 150 MG 24 hr tablet Take 1 tablet (150 mg total) by  mouth daily. 03/09/23   Sheron Dixons, MD  donepezil  (ARICEPT ) 10 MG tablet Take 1 tablet (10 mg total) by mouth at bedtime. 03/30/23   Sheron Dixons, MD  hydrochlorothiazide  (HYDRODIURIL ) 12.5 MG tablet Take 1 tablet (12.5 mg total) by mouth daily. 03/09/23   Sheron Dixons, MD  lisinopril  (ZESTRIL ) 30 MG tablet Take 1 tablet (30 mg total) by mouth daily. 03/09/23   Sheron Dixons, MD  meloxicam (MOBIC) 7.5 MG tablet Take 7.5 mg by mouth daily.    [provider]  metoprolol  succinate (TOPROL -XL) 25 MG 24 hr tablet Take 1 tablet (25 mg total) by mouth daily. 03/09/23   Sheron Dixons, MD  mirtazapine  (REMERON ) 15 MG tablet TAKE  1 TABLET BY MOUTH EVERYDAY AT BEDTIME 04/24/23   Sheron Dixons, MD  Misc Natural Products (TART CHERRY ADVANCED PO) Take by mouth.    [provider]  Multiple Vitamin (MULTIVITAMIN WITH MINERALS) TABS tablet Take 1 tablet by mouth daily.    [provider]  ondansetron  (ZOFRAN -ODT) 4 MG disintegrating tablet Take 1 tablet (4 mg total) by mouth every 8 (eight) hours as needed for nausea or vomiting. 04/04/23   Ninetta Basket, MD  pantoprazole  (PROTONIX ) 40 MG tablet Take 1 tablet (40 mg total) by mouth at bedtime. 03/09/23   Sheron Dixons, MD  simvastatin  (ZOCOR ) 40 MG tablet Take 1 tablet (40 mg total) by mouth at bedtime. 03/09/23   Sheron Dixons, MD  vitamin E 1000 UNIT capsule Take 1,000 Units by mouth daily.    [provider]    Family History Family History  Problem Relation Age of Onset   Heart attack Mother 98   Atrial fibrillation Mother    Heart disease Father        CABG 4V, two stents since   Heart disease Paternal Grandfather    Breast cancer Neg Hx     Social History Social History   Tobacco Use   Smoking status: Never   Smokeless tobacco: Never  Vaping Use   Vaping status: Never Used  Substance Use Topics   Alcohol use: Yes    Comment: rare   Drug use: No     Allergies   Patient has no known allergies.   Review of Systems Review of Systems : negative unless otherwise stated in HPI.      Physical Exam Triage Vital Signs ED Triage Vitals  Encounter Vitals Group     BP 07/20/23 1340 (!) 188/91     Systolic BP Percentile --      Diastolic BP Percentile --      Pulse Rate 07/20/23 1340 (!) 57     Resp 07/20/23 1340 14     Temp 07/20/23 1340 98.6 F (37 C)     Temp Source 07/20/23 1340 Oral     SpO2 07/20/23 1340 99 %     Weight 07/20/23 1339 136 lb (61.7 kg)     Height 07/20/23 1339 5\' 2"  (1.575 m)     Head Circumference --      Peak Flow --      Pain Score 07/20/23 1339 9     Pain Loc --      Pain  Education --      Exclude from Growth Chart --    No data found.  Updated Vital Signs BP (!) 181/90 (BP Location: Left Arm)   Pulse (!) 57   Temp 98.6 F (37 C) (Oral)  Resp 14   Ht 5\' 2"  (1.575 m)   Wt 61.7 kg   SpO2 99%   BMI 24.87 kg/m   Visual Acuity Right Eye Distance:   Left Eye Distance:   Bilateral Distance:    Right Eye Near:   Left Eye Near:    Bilateral Near:     Physical Exam  GEN:     alert, uncomfortable appearing female   HENT:  mucus membranes moist, oropharyngeal without lesions exudates or erythema, nasal discharge, bilateral TM normal, posterior right lower 1st molar tender to percussion, no trismus, no secretion pooling, no visible swelling of the floor the mouth, mucus membranes moist, right cheek near molars with edema and erythema, small area of purulence with induration noted with at the 1st molar gumline, no fluctuance, limited jaw movement  due to pain, no uvula swelling  NECK:  normal ROM, no lymphadenopathy, no meningismus   RESP:  no increased work of breathing, clear bilaterally  CVS:   regular rate and rhythm Skin:   warm and dry, no rash on external jaw    UC Treatments / Results  Labs (all labs ordered are listed, but only abnormal results are displayed) Labs Reviewed - No data to display  EKG   Radiology No results found.  Procedures Procedures (including critical care time) Procedures Ear Cerumen Removal   Date/Time: 03/03/2019 9:08 PM Performed by: nursing staff Authorized by: Fidel Huddle, DO   Consent:    Consent obtained:  Verbal   Consent given by:  Patient   Risks discussed:  Bleeding, dizziness, infection, incomplete removal, TM perforation and pain   Alternatives discussed:  No treatment Procedure details:    Location:  Right ear   Procedure type: irrigation   Post-procedure details:    Inspection:  TM not visible due to ongoing cerumen impaction   Hearing quality:  Improved   Patient tolerance of  procedure: Patient terminated procedure due to discomfort    Medications Ordered in UC Medications  cefTRIAXone (ROCEPHIN) injection 1 g (1 g Intramuscular Given 07/20/23 1425)    Initial Impression / Assessment and Plan / UC Course  I have reviewed the triage vital signs and the nursing notes.  Pertinent labs & imaging results that were available during my care of the patient were reviewed by me and considered in my medical decision making (see chart for details).     Pt is a 73 y.o. female who presents for right facial swelling with facial and dental pain. Amauri is afebrile here. Satting well on room air. Overall pt is uncomfortable appearing well hydrated, without respiratory distress.  Dental exam concerning for dental abscess.  Patient given ceftriaxone 1 g IM here.  Augmentin twice daily for 10 days also prescribed.  Follow-up with her dentist. Continue Tylenol  and/or Motrin as needed for discomfort.  Gargle with salt water several times a day.  Considered ED evaluation for CT to rule out possible neck mass or salivary stone blockage.  Patient would like to avoid the emergency department at this time.  Strict ED precautions given and she voiced understanding.   Ceruminosis is noted on the right.  Patient having otalgia on the right as well.  Wax is removed by syringing debridement.  On reassessment, TM still not visible.  Patient terminated procedure due to discomfort.  However, hearing has improved. Instructions for home care to prevent wax buildup are given.  Given contact information for [facility].  Ailea is hypertensive here.  BP 188/91 then  after sitting was 181/91. Previous blood pressures from chart review were mostly well-controlled however in January she had some blood systolic blood pressures between 140 and 150.  I suspect high blood pressure issue is secondary to pain. Takes lisinopril , metoprolol  and HCTZ.  Denies needing refills. Recommended she check herblood pressure  and follow up with her primary care provider in the next 2-3 weeks.    Discussed MDM, treatment plan and plan for follow-up with patient who agrees with plan.   Final Clinical Impressions(s) / UC Diagnoses   Final diagnoses:  Facial swelling  Impacted cerumen of right ear  Elevated blood pressure reading with diagnosis of hypertension     Discharge Instructions      You were given an injection to help jumpstart your treatment today.  Stop by the pharmacy to pick up your antibiotics.  If the swelling gets significantly worse or you have difficulty breathing, go to the hospital emergency department.  Be sure to take your blood pressure medications when you get home.  Keep a log of your blood pressures at home.  Follow-up with your PCP in the next 2 to 3 weeks regarding your elevated blood pressures.  Consider using Debrox at home to help prevent earwax buildup.  Call Dr. Alene Husk office to have your ears vacuumed out.       ED Prescriptions     Medication Sig Dispense Auth. Provider   amoxicillin-clavulanate (AUGMENTIN) 875-125 MG tablet Take 1 tablet by mouth every 12 (twelve) hours. 20 tablet Briany Aye, DO      PDMP not reviewed this encounter.   Tiffanyann Deroo, DO 07/21/23 1008

## 2023-07-20 NOTE — Discharge Instructions (Addendum)
 You were given an injection to help jumpstart your treatment today.  Stop by the pharmacy to pick up your antibiotics.  If the swelling gets significantly worse or you have difficulty breathing, go to the hospital emergency department.  Be sure to take your blood pressure medications when you get home.  Keep a log of your blood pressures at home.  Follow-up with your PCP in the next 2 to 3 weeks regarding your elevated blood pressures.  Consider using Debrox at home to help prevent earwax buildup.  Call Dr. Alene Husk office to have your ears vacuumed out.

## 2023-07-20 NOTE — ED Triage Notes (Addendum)
 Patient reports dental pain, facial swelling and right sided facial pain that started 2 days ago.  Patient denies fevers.

## 2023-08-21 ENCOUNTER — Ambulatory Visit: Payer: Self-pay | Admitting: Internal Medicine

## 2023-08-21 ENCOUNTER — Encounter: Payer: Self-pay | Admitting: Internal Medicine

## 2023-08-21 DIAGNOSIS — N1831 Chronic kidney disease, stage 3a: Secondary | ICD-10-CM | POA: Insufficient documentation

## 2023-08-21 NOTE — Assessment & Plan Note (Deleted)
 Blood pressure is well controlled.  Current medications lisinopril , hydrochlorothiazide  and metoprolol . Will continue same regimen along with efforts to limit dietary sodium.

## 2023-08-21 NOTE — Progress Notes (Deleted)
 Date:  08/21/2023   Name:  Carolyn Lowe   DOB:  1950/03/28   MRN:  [id number]   Chief Complaint: No chief complaint on file.  Hypertension This is a chronic problem. The problem is controlled. Pertinent negatives include no chest pain, headaches, palpitations or shortness of breath. Past treatments include ACE inhibitors, diuretics and beta blockers. Hypertensive end-organ damage includes kidney disease. There is no history of CAD/MI or CVA.    Review of Systems  Constitutional:  Negative for fatigue and unexpected weight change.  HENT:  Negative for trouble swallowing.   Eyes:  Negative for visual disturbance.  Respiratory:  Negative for cough, chest tightness, shortness of breath and wheezing.   Cardiovascular:  Negative for chest pain, palpitations and leg swelling.  Gastrointestinal:  Negative for abdominal pain, constipation and diarrhea.  Musculoskeletal:  Negative for arthralgias and myalgias.  Neurological:  Negative for dizziness, weakness, light-headedness and headaches.     Lab Results  Component Value Date   NA 139 04/04/2023   K 3.8 04/04/2023   CO2 25 04/04/2023   GLUCOSE 100 (H) 04/04/2023   BUN 19 04/04/2023   CREATININE 1.25 (H) 04/04/2023   CALCIUM 10.2 04/04/2023   EGFR 46 (L) 03/09/2023   GFRNONAA 46 (L) 04/04/2023   Lab Results  Component Value Date   CHOL 200 (H) 03/09/2023   HDL 54 03/09/2023   LDLCALC 111 (H) 03/09/2023   TRIG 200 (H) 03/09/2023   CHOLHDL 3.7 03/09/2023   No results found for: "TSH" No results found for: "HGBA1C" Lab Results  Component Value Date   WBC 8.9 04/04/2023   HGB 15.2 (H) 04/04/2023   HCT 43.9 04/04/2023   MCV 94.8 04/04/2023   PLT 291 04/04/2023   Lab Results  Component Value Date   ALT 15 04/04/2023   AST 18 04/04/2023   ALKPHOS 41 04/04/2023   BILITOT 0.6 04/04/2023   No results found for: "25OHVITD2", "25OHVITD3", "VD25OH"   Patient Active Problem List   Diagnosis Date Noted   CKD stage 3a,  GFR 45-59 ml/min (HCC) 08/21/2023   History of right breast cancer 12/27/2022   Major depression single episode, in partial remission (HCC) 12/27/2022   GERD (gastroesophageal reflux disease) 12/27/2022   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   MCI (mild cognitive impairment) 12/27/2022   Degenerative arthritis of knee, bilateral 12/12/2018   Arthritis of midfoot 04/16/2018   Chest pain with high risk for cardiac etiology 02/27/2016   Essential hypertension 02/27/2016   Mixed hyperlipidemia 02/27/2016   History of left breast cancer 01/14/2014   Insomnia 02/03/2013   Vulvar lesion 03/20/2005   S/P abdominal hysterectomy 03/20/1982    No Known Allergies  Past Surgical History:  Procedure Laterality Date   ABDOMINAL HYSTERECTOMY  1984   BREAST LUMPECTOMY Left 2010   BREAST LUMPECTOMY Right 2021   CARPAL TUNNEL RELEASE Right 2013   CHOLECYSTECTOMY  1999   INCISIONAL HERNIA REPAIR N/A 10/22/2013   Procedure: LAPAROSCOPIC INCISIONAL HERNIA;  Surgeon: Rogena Class, MD;  Location: [facility];  Service: General;  Laterality: N/A;   INSERTION OF MESH N/A 10/22/2013   Procedure: INSERTION OF MESH;  Surgeon: Rogena Class, MD;  Location: [facility];  Service: General;  Laterality: N/A;   NASAL SINUS SURGERY  1987   right knee arthroscopy Right 2005    Social History   Tobacco Use   Smoking status: Never   Smokeless tobacco: Never  Vaping Use   Vaping status: Never Used  Substance Use Topics   Alcohol use: Yes    Comment: rare   Drug use: No     Medication list has been reviewed and updated.  No outpatient medications have been marked as taking for the 08/21/23 encounter (Appointment) with Sheron Dixons, MD.       03/09/2023    9:10 AM 02/07/2023    3:23 PM 12/27/2022    2:06 PM  GAD 7 : Generalized Anxiety Score  Nervous, Anxious, on Edge 0 0 0  Control/stop worrying 0 0 0  Worry too much - different things 0 0 0  Trouble relaxing 0 0 0  Restless 0 0 0  Easily  annoyed or irritable 0 0 0  Afraid - awful might happen 0 0 0  Total GAD 7 Score 0 0 0  Anxiety Difficulty Not difficult at all Not difficult at all Not difficult at all       03/09/2023    9:10 AM 02/07/2023    3:23 PM 12/27/2022    2:06 PM  Depression screen PHQ 2/9  Decreased Interest 0 0 0  Down, Depressed, Hopeless 0 0 0  PHQ - 2 Score 0 0 0  Altered sleeping 0 0 3  Tired, decreased energy 0 0 3  Change in appetite 0 0 0  Feeling bad or failure about yourself  0 0 0  Trouble concentrating 0 0 0  Moving slowly or fidgety/restless 0 0 0  Suicidal thoughts 0 0 0  PHQ-9 Score 0 0 6  Difficult doing work/chores Not difficult at all Not difficult at all Not difficult at all    BP Readings from Last 3 Encounters:  07/20/23 (!) 181/90  04/04/23 121/61  03/09/23 124/60    Physical Exam Vitals and nursing note reviewed.  Constitutional:      General: She is not in acute distress.    Appearance: She is well-developed.  HENT:     Head: Normocephalic and atraumatic.  Pulmonary:     Effort: Pulmonary effort is normal. No respiratory distress.  Skin:    General: Skin is warm and dry.     Findings: No rash.  Neurological:     Mental Status: She is alert and oriented to person, place, and time.  Psychiatric:        Mood and Affect: Mood normal.        Behavior: Behavior normal.     Wt Readings from Last 3 Encounters:  07/20/23 136 lb (61.7 kg)  03/09/23 129 lb (58.5 kg)  02/07/23 132 lb (59.9 kg)    There were no vitals taken for this visit.  Assessment and Plan:  Problem List Items Addressed This Visit       Unprioritized   History of left breast cancer (Chronic)   Due for annual mammogram in September at [place].      Essential hypertension - Primary (Chronic)   Blood pressure is well controlled.  Current medications lisinopril , hydrochlorothiazide  and metoprolol . Will continue same regimen along with efforts to limit dietary sodium.       CKD stage  3a, GFR 45-59 ml/min (HCC)   Stable stage 3a for some time. Patient reminded to avoid NSAIDS, hydrate daily.       No follow-ups on file.    Sheron Dixons, MD [facility] [place]

## 2023-08-21 NOTE — Assessment & Plan Note (Deleted)
 Due for annual mammogram in September at [facility].

## 2023-08-21 NOTE — Assessment & Plan Note (Deleted)
 Stable stage 3a for some time. Patient reminded to avoid NSAIDS, hydrate daily.

## 2023-08-27 ENCOUNTER — Other Ambulatory Visit: Payer: Self-pay | Admitting: Internal Medicine

## 2023-08-27 DIAGNOSIS — I10 Essential (primary) hypertension: Secondary | ICD-10-CM

## 2023-08-27 DIAGNOSIS — E782 Mixed hyperlipidemia: Secondary | ICD-10-CM

## 2023-08-28 NOTE — Telephone Encounter (Signed)
 Requested Prescriptions  Pending Prescriptions Disp Refills   lisinopril  (ZESTRIL ) 30 MG tablet [Pharmacy Med Name: LISINOPRIL  30 MG TABLET] 90 tablet 0    Sig: TAKE 1 TABLET BY MOUTH EVERY DAY     Cardiovascular:  ACE Inhibitors Failed - 08/28/2023  9:52 AM      Failed - Cr in normal range and within 180 days    Creatinine  Date Value Ref Range Status  01/07/2014 1.0 0.6 - 1.1 mg/dL Final   Creatinine, Ser  Date Value Ref Range Status  04/04/2023 1.25 (H) 0.44 - 1.00 mg/dL Final         Failed - Last BP in normal range    BP Readings from Last 1 Encounters:  07/20/23 (!) 181/90         Failed - Valid encounter within last 6 months    Recent Outpatient Visits   None            Passed - K in normal range and within 180 days    Potassium  Date Value Ref Range Status  04/04/2023 3.8 3.5 - 5.1 mmol/L Final  01/07/2014 4.2 3.5 - 5.1 mEq/L Final         Passed - Patient is not pregnant       simvastatin  (ZOCOR ) 40 MG tablet [Pharmacy Med Name: SIMVASTATIN  40 MG TABLET] 90 tablet 1    Sig: TAKE 1 TABLET BY MOUTH EVERYDAY AT BEDTIME     Cardiovascular:  Antilipid - Statins Failed - 08/28/2023  9:52 AM      Failed - Valid encounter within last 12 months    Recent Outpatient Visits   None            Failed - Lipid Panel in normal range within the last 12 months    Cholesterol, Total  Date Value Ref Range Status  03/09/2023 200 (H) 100 - 199 mg/dL Final   LDL Chol Calc (NIH)  Date Value Ref Range Status  03/09/2023 111 (H) 0 - 99 mg/dL Final   HDL  Date Value Ref Range Status  03/09/2023 54 >39 mg/dL Final   Triglycerides  Date Value Ref Range Status  03/09/2023 200 (H) 0 - 149 mg/dL Final         Passed - Patient is not pregnant

## 2023-08-29 ENCOUNTER — Ambulatory Visit

## 2023-08-29 VITALS — Ht 61.0 in | Wt 125.0 lb

## 2023-08-29 DIAGNOSIS — Z0000 Encounter for general adult medical examination without abnormal findings: Secondary | ICD-10-CM | POA: Diagnosis not present

## 2023-08-29 NOTE — Patient Instructions (Signed)
 Carolyn Lowe , Thank you for taking time out of your busy schedule to complete your Annual Wellness Visit with me. I enjoyed our conversation and look forward to speaking with you again next year. I, as well as your care team,  appreciate your ongoing commitment to your health goals. Please review the following plan we discussed and let me know if I can assist you in the future. Your Game plan/ To Do List    Referrals: None   Follow up Visits: Next Medicare AWV with our clinical staff: 09/03/24 @ 3:20pm (PHONE VISIT)   Have you seen your provider in the last 6 months (3 months if uncontrolled diabetes)? Yes Next Office Visit with your provider: 09/07/23 @ 2:40pm with Dr. Gala Jubilee  Clinician Recommendations: Try to get records of your last colonoscopy and bring to the office.  Aim for 30 minutes of exercise or brisk walking, 6-8 glasses of water, and 5 servings of fruits and vegetables each day.       This is a list of the screening recommended for you and due dates:  Health Maintenance  Topic Date Due   Zoster (Shingles) Vaccine (1 of 2) Never done   Colon Cancer Screening  Never done   COVID-19 Vaccine (4 - Mixed Product risk 2024-25 season) 05/27/2023   Flu Shot  10/19/2023   Mammogram  12/08/2023   Medicare Annual Wellness Visit  08/28/2024   DEXA scan (bone density measurement)  11/26/2026   DTaP/Tdap/Td vaccine (2 - Td or Tdap) 04/11/2028   Pneumonia Vaccine  Completed   Hepatitis C Screening  Completed   HPV Vaccine  Aged Out   Meningitis B Vaccine  Aged Out    Advanced directives: (Copy Requested) Please bring a copy of your health care power of attorney and living will to the office to be added to your chart at your convenience. You can mail to [facility] [address] [place], [place] [address] or email to [email or web address] Advance Care Planning is important because it:  [x]  Makes sure you receive the medical care that is consistent with your  values, goals, and preferences  [x]  It provides guidance to your family and loved ones and reduces their decisional burden about whether or not they are making the right decisions based on your wishes.  Follow the link provided in your after visit summary or read over the paperwork we have mailed to you to help you started getting your Advance Directives in place. If you need assistance in completing these, please reach out to us  so that we can help you!  See attachments for Preventive Care and Fall Prevention Tips.   Fall Prevention in the Home, Adult Falls can cause injuries and affect people of all ages. There are many simple things that you can do to make your home safe and to help prevent falls. If you need it, ask for help making these changes. What actions can I take to prevent falls? General information Use good lighting in all rooms. Make sure to: Replace any light bulbs that burn out. Turn on lights if it is dark and use night-lights. Keep items that you use often in easy-to-reach places. Lower the shelves around your home if needed. Move furniture so that there are clear paths around it. Do not keep throw rugs or other things on the floor that can make you trip. If any of your floors are uneven, fix them. Add color or contrast paint or tape to clearly  mark and help you see: Grab bars or handrails. First and last steps of staircases. Where the edge of each step is. If you use a ladder or stepladder: Make sure that it is fully opened. Do not climb a closed ladder. Make sure the sides of the ladder are locked in place. Have someone hold the ladder while you use it. Know where your pets are as you move through your home. What can I do in the bathroom?     Keep the floor dry. Clean up any water that is on the floor right away. Remove soap buildup in the bathtub or shower. Buildup makes bathtubs and showers slippery. Use non-skid mats or decals on the floor of the bathtub or  shower. Attach bath mats securely with double-sided, non-slip rug tape. If you need to sit down while you are in the shower, use a non-slip stool. Install grab bars by the toilet and in the bathtub and shower. Do not use towel bars as grab bars. What can I do in the bedroom? Make sure that you have a light by your bed that is easy to reach. Do not use any sheets or blankets on your bed that hang to the floor. Have a firm bench or chair with side arms that you can use for support when you get dressed. What can I do in the kitchen? Clean up any spills right away. If you need to reach something above you, use a sturdy step stool that has a grab bar. Keep electrical cables out of the way. Do not use floor polish or wax that makes floors slippery. What can I do with my stairs? Do not leave anything on the stairs. Make sure that you have a light switch at the top and the bottom of the stairs. Have them installed if you do not have them. Make sure that there are handrails on both sides of the stairs. Fix handrails that are broken or loose. Make sure that handrails are as long as the staircases. Install non-slip stair treads on all stairs in your home if they do not have carpet. Avoid having throw rugs at the top or bottom of stairs, or secure the rugs with carpet tape to prevent them from moving. Choose a carpet design that does not hide the edge of steps on the stairs. Make sure that carpet is firmly attached to the stairs. Fix any carpet that is loose or worn. What can I do on the outside of my home? Use bright outdoor lighting. Repair the edges of walkways and driveways and fix any cracks. Clear paths of anything that can make you trip, such as tools or rocks. Add color or contrast paint or tape to clearly mark and help you see high doorway thresholds. Trim any bushes or trees on the main path into your home. Check that handrails are securely fastened and in good repair. Both sides of all steps  should have handrails. Install guardrails along the edges of any raised decks or porches. Have leaves, snow, and ice cleared regularly. Use sand, salt, or ice melt on walkways during winter months if you live where there is ice and snow. In the garage, clean up any spills right away, including grease or oil spills. What other actions can I take? Review your medicines with your health care provider. Some medicines can make you confused or feel dizzy. This can increase your chance of falling. Wear closed-toe shoes that fit well and support your feet. Wear shoes that  have rubber soles and low heels. Use a cane, walker, scooter, or crutches that help you move around if needed. Talk with your provider about other ways that you can decrease your risk of falls. This may include seeing a physical therapist to learn to do exercises to improve movement and strength. Where to find more information Centers for Disease Control and Prevention, [employer]: TonerPromos.no [employer] on Aging: BaseRingTones.pl National Institute on Aging: BaseRingTones.pl Contact a health care provider if: You are afraid of falling at home. You feel weak, drowsy, or dizzy at home. You fall at home. Get help right away if you: Lose consciousness or have trouble moving after a fall. Have a fall that causes a head injury. These symptoms may be an emergency. Get help right away. Call 911. Do not wait to see if the symptoms will go away. Do not drive yourself to the hospital. This information is not intended to replace advice given to you by your health care provider. Make sure you discuss any questions you have with your health care provider. Document Revised: 11/07/2021 Document Reviewed: 11/07/2021 Elsevier Patient Education  2024 [employer].

## 2023-08-29 NOTE — Progress Notes (Signed)
 Subjective:   Carolyn Lowe is a 73 y.o. who presents for a Medicare Wellness preventive visit.  As a reminder, Annual Wellness Visits don't include a physical exam, and some assessments may be limited, especially if this visit is performed virtually. We may recommend an in-person follow-up visit with your provider if needed.  Visit Complete: Virtual I connected with  Carolyn Lowe on 08/29/23 by a audio enabled telemedicine application and verified that I am speaking with the correct person using two identifiers.  Patient Location: Home  Provider Location: Home Office  I discussed the limitations of evaluation and management by telemedicine. The patient expressed understanding and agreed to proceed.  Vital Signs: Because this visit was a virtual/telehealth visit, some criteria may be missing or patient reported. Any vitals not documented were not able to be obtained and vitals that have been documented are patient reported.  VideoDeclined- This patient declined Librarian, academic. Therefore the visit was completed with audio only.  Persons Participating in Visit: Patient.  AWV Questionnaire: No: Patient Medicare AWV questionnaire was not completed prior to this visit.  Cardiac Risk Factors include: advanced age (>77men, >1 women);dyslipidemia;hypertension     Objective:     Today's Vitals   08/29/23 1525  Weight: 125 lb (56.7 kg)  Height: 5' 1 (1.549 m)   Body mass index is 23.62 kg/m.     08/29/2023    3:43 PM 07/20/2023    1:40 PM 04/04/2023    3:35 PM 02/27/2016    8:00 PM 10/22/2013   11:00 AM 10/16/2013    9:42 AM  Advanced Directives  Does Patient Have a Medical Advance Directive? Yes Yes Yes Yes Patient has advance directive, copy not in chart Patient has advance directive, copy not in chart  Type of Advance Directive Healthcare Power of [place];Living will Healthcare Power of [place];Living will Healthcare Power of  [place];Living will Healthcare Power of [place];Living will Healthcare Power of [place];Living will Healthcare Power of [place];Living will  Does patient want to make changes to medical advance directive? No - Patient declined   No - Patient declined No change requested   Copy of Healthcare Power of Attorney in Chart? Yes - validated most recent copy scanned in chart (See row information)   No - copy requested Copy requested from family --  Pre-existing out of facility DNR order (yellow form or pink MOST form)     No     Current Medications (verified) Outpatient Encounter Medications as of 08/29/2023  Medication Sig   anastrozole (ARIMIDEX) 1 MG tablet Take 1 mg by mouth daily.   aspirin  EC 81 MG tablet Take 81 mg by mouth daily.   buPROPion  (WELLBUTRIN  XL) 150 MG 24 hr tablet Take 1 tablet (150 mg total) by mouth daily.   donepezil  (ARICEPT ) 10 MG tablet Take 1 tablet (10 mg total) by mouth at bedtime.   hydrochlorothiazide  (HYDRODIURIL ) 12.5 MG tablet Take 1 tablet (12.5 mg total) by mouth daily.   ipratropium (ATROVENT) 0.06 % nasal spray Place 2 sprays into both nostrils 2 (two) times daily.   lisinopril  (ZESTRIL ) 30 MG tablet TAKE 1 TABLET BY MOUTH EVERY DAY   meloxicam (MOBIC) 15 MG tablet Take 15 mg by mouth daily.   metoprolol  succinate (TOPROL -XL) 25 MG 24 hr tablet Take 1 tablet (25 mg total) by mouth daily.   mirtazapine  (REMERON ) 15 MG tablet TAKE 1 TABLET BY MOUTH EVERYDAY AT BEDTIME   Misc Natural Products (TART CHERRY ADVANCED PO)  Take by mouth.   Multiple Vitamin (MULTIVITAMIN WITH MINERALS) TABS tablet Take 1 tablet by mouth daily.   nitroGLYCERIN (NITROSTAT) 0.4 MG SL tablet Place 0.4 mg under the tongue every 5 (five) minutes as needed.   pantoprazole  (PROTONIX ) 40 MG tablet Take 1 tablet (40 mg total) by mouth at bedtime.   simvastatin  (ZOCOR ) 40 MG tablet TAKE 1 TABLET BY MOUTH EVERYDAY AT BEDTIME   vitamin E 1000 UNIT capsule Take 1,000 Units by mouth daily.   Apple  Cider Vinegar 188 MG CAPS Take by mouth. (Patient not taking: Reported on 08/29/2023)   azelastine  (ASTELIN ) 0.1 % nasal spray Place 2 sprays into both nostrils 2 (two) times daily. Use in each nostril as directed (Patient not taking: Reported on 08/29/2023)   meloxicam (MOBIC) 7.5 MG tablet Take 7.5 mg by mouth daily. (Patient not taking: Reported on 08/29/2023)   ondansetron  (ZOFRAN -ODT) 4 MG disintegrating tablet Take 1 tablet (4 mg total) by mouth every 8 (eight) hours as needed for nausea or vomiting. (Patient not taking: Reported on 08/29/2023)   No facility-administered encounter medications on file as of 08/29/2023.    Allergies (verified) Patient has no known allergies.   History: Past Medical History:  Diagnosis Date   Arthritis    Brain aneurysm    Resolved spontaneously- IT SEALED IT SELF - PT WAS IN HER EARLY 30'S    Breast cancer (HCC)    Colon polyp, hyperplastic    Depression    DEPRESSION RELATED TO HX BREAST CANCER/ ARIMEDEX SIDE EFFECTS- NO LONGER ON ARIMEDEX - AND DOING BETTER   GERD (gastroesophageal reflux disease)    Hypercholesterolemia    Hypertension    Personal history of radiation therapy    Right trigger finger    Past Surgical History:  Procedure Laterality Date   ABDOMINAL HYSTERECTOMY  1984   BREAST LUMPECTOMY Left 2010   BREAST LUMPECTOMY Right 2021   CARPAL TUNNEL RELEASE Right 2013   CHOLECYSTECTOMY  1999   INCISIONAL HERNIA REPAIR N/A 10/22/2013   Procedure: LAPAROSCOPIC INCISIONAL HERNIA;  Surgeon: Rogena Class, MD;  Location: [facility];  Service: General;  Laterality: N/A;   INSERTION OF MESH N/A 10/22/2013   Procedure: INSERTION OF MESH;  Surgeon: Rogena Class, MD;  Location: [facility];  Service: General;  Laterality: N/A;   NASAL SINUS SURGERY  1987   right knee arthroscopy Right 2005   Family History  Problem Relation Age of Onset   Heart attack Mother 38   Atrial fibrillation Mother    Heart disease Father        CABG 4V, two  stents since   Heart disease Paternal Grandfather    Breast cancer Neg Hx    Social History   Socioeconomic History   Marital status: Divorced    Spouse name: Not on file   Number of children: 1   Years of education: Not on file   Highest education level: Not on file  Occupational History   Occupation: retired  Tobacco Use   Smoking status: Former    Current packs/day: 0.00    Average packs/day: 1 pack/day for 39.0 years (39.0 ttl pk-yrs)    Types: Cigarettes    Start date: 38    Quit date: 2010    Years since quitting: 15.4    Passive exposure: Past   Smokeless tobacco: Never  Vaping Use   Vaping status: Never Used  Substance and Sexual Activity   Alcohol use: Yes  Alcohol/week: 7.0 - 14.0 standard drinks of alcohol    Types: 7 - 14 Standard drinks or equivalent per week    Comment: 1-2 drinks every night   Drug use: No   Sexual activity: Not Currently    Birth control/protection: None  Other Topics Concern   Not on file  Social History Narrative   Not on file   Social Drivers of Health   Financial Resource Strain: Low Risk  (08/29/2023)   Overall Financial Resource Strain (CARDIA)    Difficulty of Paying Living Expenses: Not very hard  Food Insecurity: No Food Insecurity (08/29/2023)   Hunger Vital Sign    Worried About Running Out of Food in the Last Year: Never true    Ran Out of Food in the Last Year: Never true  Transportation Needs: No Transportation Needs (08/29/2023)   PRAPARE - Administrator, Lowe Service (Medical): No    Lack of Transportation (Non-Medical): No  Physical Activity: Inactive (08/29/2023)   Exercise Vital Sign    Days of Exercise per Week: 0 days    Minutes of Exercise per Session: 0 min  Stress: No Stress Concern Present (08/29/2023)   [employer] of Occupational Health - Occupational Stress Questionnaire    Feeling of Stress : Not at all  Social Connections: Socially Isolated (08/29/2023)   Social Connection and  Isolation Panel [NHANES]    Frequency of Communication with Friends and Family: More than three times a week    Frequency of Social Gatherings with Friends and Family: Three times a week    Attends Religious Services: Never    Active Member of Clubs or Organizations: No    Attends Banker Meetings: Never    Marital Status: Divorced    Tobacco Counseling Counseling given: No    Clinical Intake:  Pre-visit preparation completed: Yes  Pain : No/denies pain     BMI - recorded: 23.62 Nutritional Status: BMI of 19-24  Normal Nutritional Risks: None Diabetes: No  No results found for: HGBA1C   How often do you need to have someone help you when you read instructions, pamphlets, or other written materials from your doctor or pharmacy?: 1 - Never  Interpreter Needed?: No  Information entered by :: Jaunita Messier, CMA   Activities of Daily Living     08/29/2023    3:27 PM  In your present state of health, do you have any difficulty performing the following activities:  Hearing? 0  Vision? 0  Difficulty concentrating or making decisions? 0  Comment has mild cognitive impairment, on Aricept   Walking or climbing stairs? 0  Dressing or bathing? 0  Doing errands, shopping? 0  Preparing Food and eating ? N  Using the Toilet? N  In the past six months, have you accidently leaked urine? N  Do you have problems with loss of bowel control? N  Managing your Medications? N  Managing your Finances? N  Housekeeping or managing your Housekeeping? N    Patient Care Team: Sheron Dixons, MD as PCP - General (Internal Medicine) Annell Kidney, MD as Referring Physician (Ophthalmology) Norva Beecham, MD (Oncology)  I have updated your Care Teams any recent Medical Services you may have received from other providers in the past year.     Assessment:    This is a routine wellness examination for [facility].  Hearing/Vision screen Hearing Screening -  Comments:: Denies hearing loss Vision Screening - Comments:: Gets routine eye exams, [place], [place]  Goals Addressed             This Visit's Progress    Patient Stated       Maintain current health       Depression Screen     08/29/2023    3:41 PM 03/09/2023    9:10 AM 02/07/2023    3:23 PM 12/27/2022    2:06 PM  PHQ 2/9 Scores  PHQ - 2 Score 1 0 0 0  PHQ- 9 Score 1 0 0 6    Fall Risk     08/29/2023    3:45 PM 03/09/2023    9:10 AM 02/07/2023    3:23 PM 12/27/2022    2:06 PM  Fall Risk   Falls in the past year? 0 0 0 0  Number falls in past yr: 0 0 0 0  Injury with Fall? 0 0 0 0  Risk for fall due to : No Fall Risks No Fall Risks No Fall Risks No Fall Risks  Follow up Falls evaluation completed Falls evaluation completed Falls evaluation completed Falls evaluation completed    MEDICARE RISK AT HOME:  Medicare Risk at Home Any stairs in or around the home?: Yes If so, are there any without handrails?: No Home free of loose throw rugs in walkways, pet beds, electrical cords, etc?: Yes Adequate lighting in your home to reduce risk of falls?: Yes Life alert?: Yes Use of a cane, walker or w/c?: No Grab bars in the bathroom?: No Shower chair or bench in shower?: Yes Elevated toilet seat or a handicapped toilet?: No  TIMED UP AND GO:  Was the test performed?  No  Cognitive Function: 6CIT completed        08/29/2023    3:46 PM  6CIT Screen  What Year? 0 points  What month? 0 points  What time? 0 points  Count back from 20 2 points  Months in reverse 0 points  Repeat phrase 4 points  Total Score 6 points    Immunizations Immunization History  Administered Date(s) Administered   Influenza, High Dose Seasonal PF 12/04/2019, 01/20/2021, 11/16/2022   Influenza, Quadrivalent, Recombinant, Inj, Pf 12/21/2017   Influenza,inj,quad, With Preservative 03/20/2017, 02/06/2019   Influenza-Unspecified 12/18/2020, 11/27/2022   Moderna Covid-19 Fall  Seasonal Vaccine 46yrs & older 11/16/2022   PNEUMOCOCCAL CONJUGATE-20 12/27/2022   Pfizer(Comirnaty)Fall Seasonal Vaccine 12 years and older 04/25/2019, 11/27/2022   Pneumococcal Conjugate-13 03/29/2017   Pneumococcal Polysaccharide-23 01/24/2006   Pneumococcal-Unspecified 01/24/2006   Tdap 04/11/2018   Unspecified SARS-COV-2 Vaccination 04/25/2019    Screening Tests Health Maintenance  Topic Date Due   Zoster Vaccines- Shingrix  (1 of 2) Never done   Colonoscopy  Never done   COVID-19 Vaccine (4 - Mixed Product risk 2024-25 season) 05/27/2023   INFLUENZA VACCINE  10/19/2023   MAMMOGRAM  12/08/2023   Medicare Annual Wellness (AWV)  08/28/2024   DEXA SCAN  11/26/2026   DTaP/Tdap/Td (2 - Td or Tdap) 04/11/2028   Pneumonia Vaccine 76+ Years old  Completed   Hepatitis C Screening  Completed   HPV VACCINES  Aged Out   Meningococcal B Vaccine  Aged Out    Health Maintenance  Health Maintenance Due  Topic Date Due   Zoster Vaccines- Shingrix  (1 of 2) Never done   Colonoscopy  Never done   COVID-19 Vaccine (4 - Mixed Product risk 2024-25 season) 05/27/2023   Health Maintenance Items Addressed: See Nurse Notes at the end of this note  Additional Screening:  Vision  Screening: Recommended annual ophthalmology exams for early detection of glaucoma and other disorders of the eye. Would you like a referral to an eye doctor? No    Dental Screening: Recommended annual dental exams for proper oral hygiene  Community Resource Referral / Chronic Care Management: CRR required this visit?  No   CCM required this visit?  No   Plan:    I have personally reviewed and noted the following in the patient's chart:   Medical and social history Use of alcohol, tobacco or illicit drugs  Current medications and supplements including opioid prescriptions. Patient is not currently taking opioid prescriptions. Functional ability and status Nutritional status Physical activity Advanced  directives List of other physicians Hospitalizations, surgeries, and ER visits in previous 12 months Vitals Screenings to include cognitive, depression, and falls Referrals and appointments  In addition, I have reviewed and discussed with patient certain preventive protocols, quality metrics, and best practice recommendations. A written personalized care plan for preventive services as well as general preventive health recommendations were provided to patient.   Jaunita Messier, CMA   08/29/2023   After Visit Summary: (MyChart) Due to this being a telephonic visit, the after visit summary with patients personalized plan was offered to patient via MyChart   Notes:  6 CIT Score - 6 Abstracted results of DEXA scan done 11/25/21 at [facility] Oncology follows [facility] and DEXA scan per patient Unable to find results of screening colonoscopy done in [place], [place]. Patient states she will try to get records. She could not remember the name of the doctor who did her colonoscopy. Covid and flu vaccines in the fall Shingrix  at local pharmacy Scheduled OV for 09/07/23 @ 2:40pm. Patient no showed last appt on 08/21/23

## 2023-09-01 ENCOUNTER — Other Ambulatory Visit: Payer: Self-pay | Admitting: Internal Medicine

## 2023-09-01 DIAGNOSIS — I10 Essential (primary) hypertension: Secondary | ICD-10-CM

## 2023-09-01 DIAGNOSIS — F324 Major depressive disorder, single episode, in partial remission: Secondary | ICD-10-CM

## 2023-09-04 ENCOUNTER — Telehealth: Payer: Self-pay | Admitting: Internal Medicine

## 2023-09-04 NOTE — Telephone Encounter (Signed)
 Left voice mail for patient to confirm what insurance she has. Please document and route back.

## 2023-09-04 NOTE — Telephone Encounter (Signed)
 Requested by interface surescripts. Last OV 03/09/23. Courtesy refill. Future visit 09/07/23.  Requested Prescriptions  Pending Prescriptions Disp Refills   buPROPion  (WELLBUTRIN  XL) 150 MG 24 hr tablet [Pharmacy Med Name: BUPROPION  HCL XL 150 MG TABLET] 90 tablet 0    Sig: TAKE 1 TABLET BY MOUTH EVERY DAY     Psychiatry: Antidepressants - bupropion  Failed - 09/04/2023  9:41 AM      Failed - Cr in normal range and within 360 days    Creatinine  Date Value Ref Range Status  01/07/2014 1.0 0.6 - 1.1 mg/dL Final   Creatinine, Ser  Date Value Ref Range Status  04/04/2023 1.25 (H) 0.44 - 1.00 mg/dL Final         Failed - Last BP in normal range    BP Readings from Last 1 Encounters:  07/20/23 (!) 181/90         Failed - Valid encounter within last 6 months    Recent Outpatient Visits   None            Passed - AST in normal range and within 360 days    AST  Date Value Ref Range Status  04/04/2023 18 15 - 41 U/L Final  01/07/2014 25 5 - 34 U/L Final         Passed - ALT in normal range and within 360 days    ALT  Date Value Ref Range Status  04/04/2023 15 0 - 44 U/L Final  01/07/2014 32 0 - 55 U/L Final         Passed - Completed PHQ-2 or PHQ-9 in the last 360 days       hydrochlorothiazide  (HYDRODIURIL ) 12.5 MG tablet [Pharmacy Med Name: HYDROCHLOROTHIAZIDE  12.5 MG TB] 90 tablet 0    Sig: TAKE 1 TABLET BY MOUTH EVERY DAY     Cardiovascular: Diuretics - Thiazide Failed - 09/04/2023  9:41 AM      Failed - Cr in normal range and within 180 days    Creatinine  Date Value Ref Range Status  01/07/2014 1.0 0.6 - 1.1 mg/dL Final   Creatinine, Ser  Date Value Ref Range Status  04/04/2023 1.25 (H) 0.44 - 1.00 mg/dL Final         Failed - Last BP in normal range    BP Readings from Last 1 Encounters:  07/20/23 (!) 181/90         Failed - Valid encounter within last 6 months    Recent Outpatient Visits   None            Passed - K in normal range and within 180  days    Potassium  Date Value Ref Range Status  04/04/2023 3.8 3.5 - 5.1 mmol/L Final  01/07/2014 4.2 3.5 - 5.1 mEq/L Final         Passed - Na in normal range and within 180 days    Sodium  Date Value Ref Range Status  04/04/2023 139 135 - 145 mmol/L Final  03/09/2023 141 134 - 144 mmol/L Final  01/07/2014 140 136 - 145 mEq/L Final

## 2023-09-07 ENCOUNTER — Ambulatory Visit: Admitting: Internal Medicine

## 2023-09-10 ENCOUNTER — Other Ambulatory Visit: Payer: Self-pay | Admitting: Internal Medicine

## 2023-09-10 ENCOUNTER — Encounter: Payer: Self-pay | Admitting: Internal Medicine

## 2023-09-10 ENCOUNTER — Ambulatory Visit (INDEPENDENT_AMBULATORY_CARE_PROVIDER_SITE_OTHER): Admitting: Internal Medicine

## 2023-09-10 VITALS — BP 128/79 | HR 60 | Ht 61.0 in | Wt 122.0 lb

## 2023-09-10 DIAGNOSIS — N1831 Chronic kidney disease, stage 3a: Secondary | ICD-10-CM | POA: Diagnosis not present

## 2023-09-10 DIAGNOSIS — K219 Gastro-esophageal reflux disease without esophagitis: Secondary | ICD-10-CM | POA: Diagnosis not present

## 2023-09-10 DIAGNOSIS — F324 Major depressive disorder, single episode, in partial remission: Secondary | ICD-10-CM | POA: Diagnosis not present

## 2023-09-10 DIAGNOSIS — M778 Other enthesopathies, not elsewhere classified: Secondary | ICD-10-CM

## 2023-09-10 DIAGNOSIS — I10 Essential (primary) hypertension: Secondary | ICD-10-CM

## 2023-09-10 MED ORDER — PREDNISONE 10 MG PO TABS: ORAL_TABLET | ORAL | 0 refills | Status: AC

## 2023-09-10 NOTE — Progress Notes (Signed)
 Date:  09/10/2023   Name:  Carolyn Lowe   DOB:  1950-12-05   MRN:  [id number]   Chief Complaint: Hypertension and Depression  Hypertension Pertinent negatives include no chest pain, headaches, palpitations or shortness of breath.  Depression        Associated symptoms include no fatigue, no myalgias and no headaches. Shoulder Pain  The pain is present in the right shoulder and right arm. This is a new problem. The current episode started 1 to 4 weeks ago. The problem occurs daily. The problem has been unchanged. The quality of the pain is described as aching. The pain is mild. Pertinent negatives include no inability to bear weight, joint locking, joint swelling, numbness or stiffness.    Review of Systems  Constitutional:  Negative for fatigue and unexpected weight change.  HENT:  Negative for trouble swallowing.   Eyes:  Negative for visual disturbance.  Respiratory:  Negative for cough, chest tightness, shortness of breath and wheezing.   Cardiovascular:  Negative for chest pain, palpitations and leg swelling.  Gastrointestinal:  Negative for abdominal pain, constipation and diarrhea.  Musculoskeletal:  Positive for arthralgias (knees, right upper arm). Negative for myalgias and stiffness.  Neurological:  Negative for dizziness, weakness, light-headedness, numbness and headaches.  Psychiatric/Behavioral:  Positive for depression. Negative for dysphoric mood and sleep disturbance. The patient is not nervous/anxious.      Lab Results  Component Value Date   NA 139 04/04/2023   K 3.8 04/04/2023   CO2 25 04/04/2023   GLUCOSE 100 (H) 04/04/2023   BUN 19 04/04/2023   CREATININE 1.25 (H) 04/04/2023   CALCIUM 10.2 04/04/2023   EGFR 46 (L) 03/09/2023   GFRNONAA 46 (L) 04/04/2023   Lab Results  Component Value Date   CHOL 200 (H) 03/09/2023   HDL 54 03/09/2023   LDLCALC 111 (H) 03/09/2023   TRIG 200 (H) 03/09/2023   CHOLHDL 3.7 03/09/2023   No results found for:  TSH No results found for: HGBA1C Lab Results  Component Value Date   WBC 8.9 04/04/2023   HGB 15.2 (H) 04/04/2023   HCT 43.9 04/04/2023   MCV 94.8 04/04/2023   PLT 291 04/04/2023   Lab Results  Component Value Date   ALT 15 04/04/2023   AST 18 04/04/2023   ALKPHOS 41 04/04/2023   BILITOT 0.6 04/04/2023   No results found for: MARIEN BOLLS, VD25OH   Patient Active Problem List   Diagnosis Date Noted   CKD stage 3a, GFR 45-59 ml/min (HCC) 08/21/2023   History of right breast cancer 12/27/2022   Major depression single episode, in partial remission (HCC) 12/27/2022   GERD (gastroesophageal reflux disease) 12/27/2022   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   MCI (mild cognitive impairment) 12/27/2022   Degenerative arthritis of knee, bilateral 12/12/2018   Arthritis of midfoot 04/16/2018   Chest pain with high risk for cardiac etiology 02/27/2016   Essential hypertension 02/27/2016   Mixed hyperlipidemia 02/27/2016   History of left breast cancer 01/14/2014   Insomnia 02/03/2013   Vulvar lesion 03/20/2005   S/P abdominal hysterectomy 03/20/1982    No Known Allergies  Past Surgical History:  Procedure Laterality Date   ABDOMINAL HYSTERECTOMY  1984   BREAST LUMPECTOMY Left 2010   BREAST LUMPECTOMY Right 2021   CARPAL TUNNEL RELEASE Right 2013   CHOLECYSTECTOMY  1999   INCISIONAL HERNIA REPAIR N/A 10/22/2013   Procedure: LAPAROSCOPIC INCISIONAL HERNIA;  Surgeon: Vicenta DELENA Poli, MD;  Location: [facility];  Service: General;  Laterality: N/A;   INSERTION OF MESH N/A 10/22/2013   Procedure: INSERTION OF MESH;  Surgeon: Vicenta DELENA Poli, MD;  Location: [facility];  Service: General;  Laterality: N/A;   NASAL SINUS SURGERY  1987   right knee arthroscopy Right 2005    Social History   Tobacco Use   Smoking status: Former    Current packs/day: 0.00    Average packs/day: 1 pack/day for 39.0 years (39.0 ttl pk-yrs)    Types: Cigarettes    Start date: 56     Quit date: 2010    Years since quitting: 15.4    Passive exposure: Past   Smokeless tobacco: Never  Vaping Use   Vaping status: Never Used  Substance Use Topics   Alcohol use: Yes    Alcohol/week: 7.0 - 14.0 standard drinks of alcohol    Types: 7 - 14 Standard drinks or equivalent per week    Comment: 1-2 drinks every night   Drug use: No     Medication list has been reviewed and updated.  Current Meds  Medication Sig   anastrozole (ARIMIDEX) 1 MG tablet Take 1 mg by mouth daily.   aspirin  EC 81 MG tablet Take 81 mg by mouth daily.   buPROPion  (WELLBUTRIN  XL) 150 MG 24 hr tablet TAKE 1 TABLET BY MOUTH EVERY DAY   donepezil  (ARICEPT ) 10 MG tablet Take 1 tablet (10 mg total) by mouth at bedtime.   hydrochlorothiazide  (HYDRODIURIL ) 12.5 MG tablet TAKE 1 TABLET BY MOUTH EVERY DAY   ipratropium (ATROVENT) 0.06 % nasal spray Place 2 sprays into both nostrils 2 (two) times daily.   lisinopril  (ZESTRIL ) 30 MG tablet TAKE 1 TABLET BY MOUTH EVERY DAY   meloxicam (MOBIC) 15 MG tablet Take 15 mg by mouth daily.   metoprolol  succinate (TOPROL -XL) 25 MG 24 hr tablet Take 1 tablet (25 mg total) by mouth daily.   mirtazapine  (REMERON ) 15 MG tablet TAKE 1 TABLET BY MOUTH EVERYDAY AT BEDTIME   Misc Natural Products (TART CHERRY ADVANCED PO) Take by mouth.   Multiple Vitamin (MULTIVITAMIN WITH MINERALS) TABS tablet Take 1 tablet by mouth daily.   nitroGLYCERIN (NITROSTAT) 0.4 MG SL tablet Place 0.4 mg under the tongue every 5 (five) minutes as needed.   pantoprazole  (PROTONIX ) 40 MG tablet Take 1 tablet (40 mg total) by mouth at bedtime.   predniSONE (DELTASONE) 10 MG tablet Take 6 tablets (60 mg total) by mouth daily with breakfast for 1 day, THEN 5 tablets (50 mg total) daily with breakfast for 1 day, THEN 4 tablets (40 mg total) daily with breakfast for 1 day, THEN 3 tablets (30 mg total) daily with breakfast for 1 day, THEN 2 tablets (20 mg total) daily with breakfast for 1 day, THEN 1 tablet (10  mg total) daily with breakfast for 1 day.   simvastatin  (ZOCOR ) 40 MG tablet TAKE 1 TABLET BY MOUTH EVERYDAY AT BEDTIME   vitamin E 1000 UNIT capsule Take 1,000 Units by mouth daily.       09/10/2023    1:43 PM 03/09/2023    9:10 AM 02/07/2023    3:23 PM 12/27/2022    2:06 PM  GAD 7 : Generalized Anxiety Score  Nervous, Anxious, on Edge 2 0 0 0  Control/stop worrying 1 0 0 0  Worry too much - different things 1 0 0 0  Trouble relaxing 0 0 0 0  Restless 1 0 0 0  Easily annoyed or irritable 2 0  0 0  Afraid - awful might happen 0 0 0 0  Total GAD 7 Score 7 0 0 0  Anxiety Difficulty Not difficult at all Not difficult at all Not difficult at all Not difficult at all       09/10/2023    1:43 PM 08/29/2023    3:41 PM 03/09/2023    9:10 AM  Depression screen PHQ 2/9  Decreased Interest 1 0 0  Down, Depressed, Hopeless 0 1 0  PHQ - 2 Score 1 1 0  Altered sleeping 2 0 0  Tired, decreased energy 2 0 0  Change in appetite 2 0 0  Feeling bad or failure about yourself  0 0 0  Trouble concentrating 0 0 0  Moving slowly or fidgety/restless 0 0 0  Suicidal thoughts 0 0 0  PHQ-9 Score 7 1 0  Difficult doing work/chores Not difficult at all Not difficult at all Not difficult at all    BP Readings from Last 3 Encounters:  09/10/23 128/79  07/20/23 (!) 181/90  04/04/23 121/61    Physical Exam Vitals and nursing note reviewed.  Constitutional:      General: She is not in acute distress.    Appearance: She is well-developed.  HENT:     Head: Normocephalic and atraumatic.   Cardiovascular:     Rate and Rhythm: Normal rate and regular rhythm.     Pulses: Normal pulses.     Heart sounds: No murmur heard. Pulmonary:     Effort: Pulmonary effort is normal. No respiratory distress.     Breath sounds: No wheezing or rhonchi.   Musculoskeletal:     Right shoulder: Normal. No tenderness or bony tenderness. Normal range of motion.     Right upper arm: Tenderness (over bicep and  supraspinatous tendons) present.     Cervical back: Normal range of motion.     Right lower leg: No edema.     Left lower leg: No edema.  Lymphadenopathy:     Cervical: No cervical adenopathy.   Skin:    General: Skin is warm and dry.     Findings: No rash.   Neurological:     Mental Status: She is alert and oriented to person, place, and time.   Psychiatric:        Mood and Affect: Mood normal.        Behavior: Behavior normal.     Wt Readings from Last 3 Encounters:  09/10/23 122 lb (55.3 kg)  08/29/23 125 lb (56.7 kg)  07/20/23 136 lb (61.7 kg)    BP 128/79   Pulse 60   Ht 5' 1 (1.549 m)   Wt 122 lb (55.3 kg)   SpO2 95%   BMI 23.05 kg/m   Assessment and Plan:  Problem List Items Addressed This Visit       Unprioritized   Essential hypertension - Primary (Chronic)   Blood pressure is fairly well controlled.  Current medications are metoprolol , lisinopril  and hydrochlorothiazide . Will continue same regimen along with efforts to limit dietary sodium.       Relevant Orders   Comprehensive metabolic panel with GFR   Major depression single episode, in partial remission (HCC) (Chronic)   Clinically stable on Mirtazapine  and Bupropion .   No SI or HI on evaluation. Plan to continue same medications for now.       CKD stage 3a, GFR 45-59 ml/min (HCC) (Chronic)   Recommend monitoring renal function twice per year. Last GFR 46.  Advised to avoid NSAIDS but she is taking Mobic 15 mg daily for OA knees Will try to reduce the dose after vacation Check renal labs       Relevant Orders   Comprehensive metabolic panel with GFR   Parathyroid hormone, intact (no Ca)   Phosphorus   GERD (gastroesophageal reflux disease)   Reflux symptoms are controlled on PPI. Patient denies red flag symptoms - no melena, weight loss, dysphagia.       Other Visit Diagnoses       Tendonitis of shoulder, right       on Mobic will add steroid taper; recommend icing and  continued topical rubs May need to see Ortho or SM if no improvement   Relevant Medications   predniSONE (DELTASONE) 10 MG tablet       Return in about 6 months (around 03/11/2024) for CPX.    Leita HILARIO Adie, MD [facility] [place]

## 2023-09-10 NOTE — Assessment & Plan Note (Addendum)
 Recommend monitoring renal function twice per year. Last GFR 46. Advised to avoid NSAIDS but she is taking Mobic 15 mg daily for OA knees Will try to reduce the dose after vacation Check renal labs

## 2023-09-10 NOTE — Assessment & Plan Note (Signed)
 Reflux symptoms are controlled on PPI. Patient denies red flag symptoms - no melena, weight loss, dysphagia.

## 2023-09-10 NOTE — Assessment & Plan Note (Signed)
 Blood pressure is fairly well controlled.  Current medications are metoprolol , lisinopril  and hydrochlorothiazide . Will continue same regimen along with efforts to limit dietary sodium.

## 2023-09-10 NOTE — Assessment & Plan Note (Signed)
 Clinically stable on Mirtazapine  and Bupropion .   No SI or HI on evaluation. Plan to continue same medications for now.

## 2023-09-11 NOTE — Telephone Encounter (Signed)
 Requested Prescriptions  Pending Prescriptions Disp Refills   pantoprazole  (PROTONIX ) 40 MG tablet [Pharmacy Med Name: PANTOPRAZOLE  SOD DR 40 MG TAB] 90 tablet 1    Sig: TAKE 1 TABLET BY MOUTH EVERYDAY AT BEDTIME     Gastroenterology: Proton Pump Inhibitors Failed - 09/11/2023  3:06 PM      Failed - Valid encounter within last 12 months    Recent Outpatient Visits           Yesterday Essential hypertension   [place] [facility] at [facility], Leita DEL, MD       Future Appointments             In 6 months Justus Leita DEL, MD [facility] at [facility]             metoprolol  succinate (TOPROL -XL) 25 MG 24 hr tablet [Pharmacy Med Name: METOPROLOL  SUCC ER 25 MG TAB] 90 tablet 1    Sig: TAKE 1 TABLET (25 MG TOTAL) BY MOUTH DAILY.     Cardiovascular:  Beta Blockers Failed - 09/11/2023  3:06 PM      Failed - Valid encounter within last 6 months    Recent Outpatient Visits           Yesterday Essential hypertension   [place] [facility] at [facility], Leita DEL, MD       Future Appointments             In 6 months Justus Leita DEL, MD [facility] at [facility], [facility]            Passed - Last BP in normal range    BP Readings from Last 1 Encounters:  09/10/23 128/79         Passed - Last Heart Rate in normal range    Pulse Readings from Last 1 Encounters:  09/10/23 60

## 2023-09-12 ENCOUNTER — Ambulatory Visit: Payer: Self-pay | Admitting: Internal Medicine

## 2023-09-12 LAB — COMPREHENSIVE METABOLIC PANEL WITH GFR
ALT: 14 IU/L (ref 0–32)
AST: 19 IU/L (ref 0–40)
Albumin: 4.8 g/dL (ref 3.8–4.8)
Alkaline Phosphatase: 58 IU/L (ref 44–121)
BUN/Creatinine Ratio: 17 (ref 12–28)
BUN: 22 mg/dL (ref 8–27)
Bilirubin Total: 0.5 mg/dL (ref 0.0–1.2)
CO2: 23 mmol/L (ref 20–29)
Calcium: 10.1 mg/dL (ref 8.7–10.3)
Chloride: 104 mmol/L (ref 96–106)
Creatinine, Ser: 1.32 mg/dL — ABNORMAL HIGH (ref 0.57–1.00)
Globulin, Total: 2.2 g/dL (ref 1.5–4.5)
Glucose: 74 mg/dL (ref 70–99)
Potassium: 5.1 mmol/L (ref 3.5–5.2)
Sodium: 142 mmol/L (ref 134–144)
Total Protein: 7 g/dL (ref 6.0–8.5)
eGFR: 43 mL/min/{1.73_m2} — ABNORMAL LOW (ref >59)

## 2023-09-12 LAB — PARATHYROID HORMONE, INTACT (NO CA): PTH: 45 pg/mL (ref 15–65)

## 2023-09-12 LAB — PHOSPHORUS: Phosphorus: 3.9 mg/dL (ref 3.0–4.3)

## 2023-09-28 NOTE — Discharge Instructions (Signed)

## 2023-10-02 ENCOUNTER — Encounter: Payer: Self-pay | Admitting: Ophthalmology

## 2023-10-03 ENCOUNTER — Encounter: Payer: Self-pay | Admitting: Anesthesiology

## 2023-10-03 ENCOUNTER — Ambulatory Visit: Payer: Self-pay | Admitting: Anesthesiology

## 2023-10-03 ENCOUNTER — Ambulatory Visit
Admission: RE | Admit: 2023-10-03 | Discharge: 2023-10-03 | Disposition: A | Discharge status: Home / Self Care | Attending: Ophthalmology | Admitting: Ophthalmology

## 2023-10-03 ENCOUNTER — Encounter
Admission: RE | Disposition: A | Discharge status: Home / Self Care | Payer: Self-pay | Source: Home / Self Care | Attending: Ophthalmology

## 2023-10-03 ENCOUNTER — Encounter: Payer: Self-pay | Admitting: Ophthalmology

## 2023-10-03 ENCOUNTER — Other Ambulatory Visit: Payer: Self-pay

## 2023-10-03 DIAGNOSIS — I10 Essential (primary) hypertension: Secondary | ICD-10-CM | POA: Diagnosis not present

## 2023-10-03 DIAGNOSIS — H2512 Age-related nuclear cataract, left eye: Secondary | ICD-10-CM | POA: Insufficient documentation

## 2023-10-03 DIAGNOSIS — N289 Disorder of kidney and ureter, unspecified: Secondary | ICD-10-CM | POA: Insufficient documentation

## 2023-10-03 DIAGNOSIS — E119 Type 2 diabetes mellitus without complications: Secondary | ICD-10-CM | POA: Diagnosis not present

## 2023-10-03 DIAGNOSIS — Z79899 Other long term (current) drug therapy: Secondary | ICD-10-CM | POA: Insufficient documentation

## 2023-10-03 DIAGNOSIS — K219 Gastro-esophageal reflux disease without esophagitis: Secondary | ICD-10-CM | POA: Diagnosis not present

## 2023-10-03 DIAGNOSIS — Z8249 Family history of ischemic heart disease and other diseases of the circulatory system: Secondary | ICD-10-CM | POA: Diagnosis not present

## 2023-10-03 DIAGNOSIS — F32A Depression, unspecified: Secondary | ICD-10-CM | POA: Insufficient documentation

## 2023-10-03 DIAGNOSIS — Z87891 Personal history of nicotine dependence: Secondary | ICD-10-CM | POA: Insufficient documentation

## 2023-10-03 HISTORY — DX: Other specified postprocedural states: Z98.890

## 2023-10-03 HISTORY — PX: CATARACT EXTRACTION W/PHACO: SHX586

## 2023-10-03 SURGERY — PHACOEMULSIFICATION, CATARACT, WITH IOL INSERTION
Anesthesia: Monitor Anesthesia Care | Site: Eye | Laterality: Left

## 2023-10-03 MED ORDER — OXYCODONE HCL 5 MG/5ML PO SOLN: 5.0000 mg | Freq: Once | ORAL | Status: DC | PRN

## 2023-10-03 MED ORDER — TETRACAINE HCL 0.5 % OP SOLN
1.0000 [drp] | OPHTHALMIC | Status: DC | PRN
  Administered 2023-10-03 (×3): 1 [drp] via OPHTHALMIC

## 2023-10-03 MED ORDER — LIDOCAINE HCL (PF) 2 % IJ SOLN
INTRAOCULAR | Status: DC | PRN
  Administered 2023-10-03: 2 mL

## 2023-10-03 MED ORDER — FENTANYL CITRATE (PF) 100 MCG/2ML IJ SOLN
INTRAMUSCULAR | Status: AC
  Filled 2023-10-03: qty 2

## 2023-10-03 MED ORDER — BRIMONIDINE TARTRATE-TIMOLOL 0.2-0.5 % OP SOLN
OPHTHALMIC | Status: DC | PRN
  Administered 2023-10-03: 1 [drp] via OPHTHALMIC

## 2023-10-03 MED ORDER — SIGHTPATH DOSE#1 BSS IO SOLN
INTRAOCULAR | Status: DC | PRN
  Administered 2023-10-03: 67 mL via OPHTHALMIC

## 2023-10-03 MED ORDER — DROPERIDOL 2.5 MG/ML IJ SOLN: 0.6250 mg | Freq: Once | INTRAMUSCULAR | Status: DC | PRN

## 2023-10-03 MED ORDER — SIGHTPATH DOSE#1 NA HYALUR & NA CHOND-NA HYALUR IO KIT
PACK | INTRAOCULAR | Status: DC | PRN
  Administered 2023-10-03: 1 via OPHTHALMIC

## 2023-10-03 MED ORDER — FENTANYL CITRATE (PF) 100 MCG/2ML IJ SOLN: 25.0000 ug | INTRAMUSCULAR | Status: DC | PRN

## 2023-10-03 MED ORDER — LACTATED RINGERS IV SOLN: INTRAVENOUS | Status: DC

## 2023-10-03 MED ORDER — ACETAMINOPHEN 10 MG/ML IV SOLN: 1000.0000 mg | Freq: Once | INTRAVENOUS | Status: DC | PRN

## 2023-10-03 MED ORDER — ARMC OPHTHALMIC DILATING DROPS
1.0000 | OPHTHALMIC | Status: DC | PRN
  Administered 2023-10-03 (×3): 1 via OPHTHALMIC

## 2023-10-03 MED ORDER — FENTANYL CITRATE (PF) 100 MCG/2ML IJ SOLN
INTRAMUSCULAR | Status: DC | PRN
  Administered 2023-10-03: 50 ug via INTRAVENOUS

## 2023-10-03 MED ORDER — CEFUROXIME OPHTHALMIC INJECTION 1 MG/0.1 ML
INJECTION | OPHTHALMIC | Status: DC | PRN
Start: 2023-10-03 — End: 2023-10-03
  Administered 2023-10-03: 1 mg via INTRACAMERAL

## 2023-10-03 MED ORDER — OXYCODONE HCL 5 MG PO TABS: 5.0000 mg | ORAL_TABLET | Freq: Once | ORAL | Status: DC | PRN

## 2023-10-03 MED ORDER — SIGHTPATH DOSE#1 BSS IO SOLN
INTRAOCULAR | Status: DC | PRN
  Administered 2023-10-03: 15 mL via INTRAOCULAR

## 2023-10-03 MED ORDER — MIDAZOLAM HCL 2 MG/2ML IJ SOLN
INTRAMUSCULAR | Status: AC
  Filled 2023-10-03: qty 2

## 2023-10-03 MED ORDER — MIDAZOLAM HCL 2 MG/2ML IJ SOLN
INTRAMUSCULAR | Status: DC | PRN
  Administered 2023-10-03: 2 mg via INTRAVENOUS

## 2023-10-03 MED ORDER — MOXIFLOXACIN HCL 0.5 % OP SOLN
OPHTHALMIC | Status: DC | PRN
  Administered 2023-10-03: .2 mL via OPHTHALMIC

## 2023-10-03 SURGICAL SUPPLY — 9 items
CATARACT SUITE SIGHTPATH (MISCELLANEOUS) ×1 IMPLANT
FEE CATARACT SUITE SIGHTPATH (MISCELLANEOUS) ×1 IMPLANT
GLOVE BIOGEL PI IND STRL 8 (GLOVE) ×1 IMPLANT
GLOVE SURG LX STRL 7.5 STRW (GLOVE) ×1 IMPLANT
GLOVE SURG SYN 6.5 PF PI BL (GLOVE) ×1 IMPLANT
LENS IOL TECNIS EYHANCE 23.0 (Intraocular Lens) IMPLANT
NDL FILTER BLUNT 18X1 1/2 (NEEDLE) ×1 IMPLANT
NEEDLE FILTER BLUNT 18X1 1/2 (NEEDLE) ×1 IMPLANT
SYR 3ML LL SCALE MARK (SYRINGE) ×1 IMPLANT

## 2023-10-03 NOTE — H&P (Signed)
 [facility]   Primary Care Physician:  Justus Leita DEL, MD Ophthalmologist: Dr. Dene Etienne  Pre-Procedure History & Physical: HPI:  Carolyn Lowe is a 73 y.o. female here for ophthalmic surgery.   Past Medical History:  Diagnosis Date   Arthritis    Brain aneurysm    Resolved spontaneously- IT SEALED IT SELF - PT WAS IN HER EARLY 30'S    Breast cancer (HCC)    Colon polyp, hyperplastic    Depression    DEPRESSION RELATED TO HX BREAST CANCER/ ARIMEDEX SIDE EFFECTS- NO LONGER ON ARIMEDEX - AND DOING BETTER   GERD (gastroesophageal reflux disease)    Hypercholesterolemia    Hypertension    Personal history of radiation therapy    PONV (postoperative nausea and vomiting)    Right trigger finger     Past Surgical History:  Procedure Laterality Date   ABDOMINAL HYSTERECTOMY  1984   BREAST LUMPECTOMY Left 2010   BREAST LUMPECTOMY Right 2021   CARPAL TUNNEL RELEASE Right 2013   CHOLECYSTECTOMY  1999   INCISIONAL HERNIA REPAIR N/A 10/22/2013   Procedure: LAPAROSCOPIC INCISIONAL HERNIA;  Surgeon: Vicenta DELENA Poli, MD;  Location: [facility];  Service: General;  Laterality: N/A;   INSERTION OF MESH N/A 10/22/2013   Procedure: INSERTION OF MESH;  Surgeon: Vicenta DELENA Poli, MD;  Location: [facility];  Service: General;  Laterality: N/A;   NASAL SINUS SURGERY  1987   right knee arthroscopy Right 2005    Prior to Admission medications   Medication Sig Start Date End Date Taking? Authorizing Provider  anastrozole (ARIMIDEX) 1 MG tablet Take 1 mg by mouth daily. 11/06/19  Yes [provider]  aspirin  EC 81 MG tablet Take 81 mg by mouth daily.   Yes [provider]  buPROPion  (WELLBUTRIN  XL) 150 MG 24 hr tablet TAKE 1 TABLET BY MOUTH EVERY DAY 09/04/23  Yes Justus Leita DEL, MD  donepezil  (ARICEPT ) 10 MG tablet Take 1 tablet (10 mg total) by mouth at bedtime. 03/30/23  Yes Justus Leita DEL, MD  hydrochlorothiazide  (HYDRODIURIL ) 12.5 MG tablet TAKE 1  TABLET BY MOUTH EVERY DAY 09/04/23  Yes Berglund, Laura H, MD  ipratropium (ATROVENT) 0.06 % nasal spray Place 2 sprays into both nostrils 2 (two) times daily. 07/27/23  Yes [provider]  lisinopril  (ZESTRIL ) 30 MG tablet TAKE 1 TABLET BY MOUTH EVERY DAY 08/28/23  Yes Justus Leita DEL, MD  meloxicam (MOBIC) 15 MG tablet Take 15 mg by mouth daily.   Yes [provider]  metoprolol  succinate (TOPROL -XL) 25 MG 24 hr tablet TAKE 1 TABLET (25 MG TOTAL) BY MOUTH DAILY. 09/11/23  Yes Justus Leita DEL, MD  mirtazapine  (REMERON ) 15 MG tablet TAKE 1 TABLET BY MOUTH EVERYDAY AT BEDTIME 04/24/23  Yes Justus Leita DEL, MD  Misc Natural Products (TART CHERRY ADVANCED PO) Take by mouth.   Yes [provider]  Multiple Vitamin (MULTIVITAMIN WITH MINERALS) TABS tablet Take 1 tablet by mouth daily.   Yes [provider]  pantoprazole  (PROTONIX ) 40 MG tablet TAKE 1 TABLET BY MOUTH EVERYDAY AT BEDTIME 09/11/23  Yes Justus Leita DEL, MD  simvastatin  (ZOCOR ) 40 MG tablet TAKE 1 TABLET BY MOUTH EVERYDAY AT BEDTIME 08/28/23  Yes Berglund, Laura H, MD  vitamin E 1000 UNIT capsule Take 1,000 Units by mouth daily.   Yes [provider]  nitroGLYCERIN (NITROSTAT) 0.4 MG SL tablet Place 0.4 mg under the tongue every 5 (five) minutes as needed.  [provider]    Allergies as of 08/22/2023   (No Known Allergies)    Family History  Problem Relation Age of Onset   Heart attack Mother 52   Atrial fibrillation Mother    Heart disease Father        CABG 4V, two stents since   Heart disease Paternal Grandfather    Breast cancer Neg Hx     Social History   Socioeconomic History   Marital status: Divorced    Spouse name: Not on file   Number of children: 1   Years of education: Not on file   Highest education level: Not on file  Occupational History   Occupation: retired  Tobacco Use   Smoking status: Former    Current packs/day: 0.00    Average packs/day: 1  pack/day for 39.0 years (39.0 ttl pk-yrs)    Types: Cigarettes    Start date: 34    Quit date: 2010    Years since quitting: 15.5    Passive exposure: Past   Smokeless tobacco: Never  Vaping Use   Vaping status: Never Used  Substance and Sexual Activity   Alcohol use: Yes    Alcohol/week: 7.0 - 14.0 standard drinks of alcohol    Types: 7 - 14 Standard drinks or equivalent per week    Comment: 1-2 drinks every night   Drug use: No   Sexual activity: Not Currently    Birth control/protection: None  Other Topics Concern   Not on file  Social History Narrative   Not on file   Social Drivers of Health   Financial Resource Strain: Low Risk  (08/29/2023)   Overall Financial Resource Strain (CARDIA)    Difficulty of Paying Living Expenses: Not very hard  Food Insecurity: No Food Insecurity (08/29/2023)   Hunger Vital Sign    Worried About Running Out of Food in the Last Year: Never true    Ran Out of Food in the Last Year: Never true  Transportation Needs: No Transportation Needs (08/29/2023)   PRAPARE - Administrator, Civil Service (Medical): No    Lack of Transportation (Non-Medical): No  Physical Activity: Inactive (08/29/2023)   Exercise Vital Sign    Days of Exercise per Week: 0 days    Minutes of Exercise per Session: 0 min  Stress: No Stress Concern Present (08/29/2023)   [employer] of Occupational Health - Occupational Stress Questionnaire    Feeling of Stress : Not at all  Social Connections: Socially Isolated (08/29/2023)   Social Connection and Isolation Panel    Frequency of Communication with Friends and Family: More than three times a week    Frequency of Social Gatherings with Friends and Family: Three times a week    Attends Religious Services: Never    Active Member of Clubs or Organizations: No    Attends Banker Meetings: Never    Marital Status: Divorced  Catering manager Violence: Not At Risk (08/29/2023)   Humiliation,  Afraid, Rape, and Kick questionnaire    Fear of Current or Ex-Partner: No    Emotionally Abused: No    Physically Abused: No    Sexually Abused: No    Review of Systems: See HPI, otherwise negative ROS  Physical Exam: BP (!) 155/78   Pulse (!) 49   Temp 97.7 F (36.5 C) (Temporal)   Ht 5' 1 (1.549 m)   Wt 56 kg   SpO2 100%   BMI 23.33 kg/m  General:   Alert,  pleasant and cooperative in NAD Head:  Normocephalic and atraumatic. Lungs:  Clear to auscultation.    Heart:  Regular rate and rhythm.   Impression/Plan: Comer DELENA Campanile is here for ophthalmic surgery.  Risks, benefits, limitations, and alternatives regarding ophthalmic surgery have been reviewed with the patient.  Questions have been answered.  All parties agreeable.   MITTIE GASKIN, MD  10/03/2023, 9:08 AM

## 2023-10-03 NOTE — Op Note (Signed)
 OPERATIVE NOTE  TALEEYA BLONDIN [id number] 10/03/2023   PREOPERATIVE DIAGNOSIS:  Nuclear sclerotic cataract left eye. H25.12   POSTOPERATIVE DIAGNOSIS:    Nuclear sclerotic cataract left eye.     PROCEDURE:  Phacoemusification with posterior chamber intraocular lens placement of the left eye  Ultrasound time: Procedure(s): PHACOEMULSIFICATION, CATARACT, WITH IOL INSERTION 3.60 00:32.0 (Left)  LENS:   Implant Name Type Inv. Item Serial No. Manufacturer Lot No. LRB No. Used Action  LENS IOL TECNIS EYHANCE 23.0 - [id number] Intraocular Lens LENS IOL TECNIS EYHANCE 23.0 [id number] [employer]  Left 1 Implanted      SURGEON:  Dene FABIENE Etienne, MD   ANESTHESIA:  Topical with tetracaine  drops and 2% Xylocaine  jelly, augmented with 1% preservative-free intracameral lidocaine .    COMPLICATIONS:  None.   DESCRIPTION OF PROCEDURE:  The patient was identified in the holding room and transported to the operating room and placed in the supine position under the operating microscope.  The left eye was identified as the operative eye and it was prepped and draped in the usual sterile ophthalmic fashion.   A 1 millimeter clear-corneal paracentesis was made at the 1:30 position.  0.5 ml of preservative-free 1% lidocaine  was injected into the anterior chamber.  The anterior chamber was filled with Viscoat viscoelastic.  A 2.4 millimeter keratome was used to make a near-clear corneal incision at the 10:30 position.  .  A curvilinear capsulorrhexis was made with a cystotome and capsulorrhexis forceps.  Balanced salt solution was used to hydrodissect and hydrodelineate the nucleus.   Phacoemulsification was then used in stop and chop fashion to remove the lens nucleus and epinucleus.  The remaining cortex was then removed using the irrigation and aspiration handpiece. Provisc was then placed into the capsular bag to distend it for lens placement.  A lens was then injected into the capsular bag.  The  remaining viscoelastic was aspirated.   Wounds were hydrated with balanced salt solution.  The anterior chamber was inflated to a physiologic pressure with balanced salt solution.  No wound leaks were noted. Cefuroxime  0.1 ml of a 10mg /ml solution was injected into the anterior chamber for a dose of 1 mg of intracameral antibiotic at the completion of the case.   Timolol  and Brimonidine  drops were applied to the eye.  The patient was taken to the recovery room in stable condition without complications of anesthesia or surgery.  Deloris Moger 10/03/2023, 9:49 AM

## 2023-10-03 NOTE — Anesthesia Postprocedure Evaluation (Signed)
 Anesthesia Post Note  Patient: ATLAS KUC  Procedure(s) Performed: PHACOEMULSIFICATION, CATARACT, WITH IOL INSERTION 3.60 00:32.0 (Left: Eye)  Patient location during evaluation: PACU Anesthesia Type: MAC Level of consciousness: awake and alert Pain management: pain level controlled Vital Signs Assessment: post-procedure vital signs reviewed and stable Respiratory status: spontaneous breathing, nonlabored ventilation, respiratory function stable and patient connected to nasal cannula oxygen Cardiovascular status: stable and blood pressure returned to baseline Postop Assessment: no apparent nausea or vomiting Anesthetic complications: no   No notable events documented.   Last Vitals:  Vitals:   10/03/23 0951 10/03/23 0956  BP: 126/76 128/81  Pulse: 61 62  Resp: 13 15  Temp: (!) 36.2 C (!) 36.2 C  SpO2: 100% 97%    Last Pain:  Vitals:   10/03/23 0956  TempSrc:   PainSc: 0-No pain                 Lynwood KANDICE Clause

## 2023-10-03 NOTE — Anesthesia Preprocedure Evaluation (Signed)
 Anesthesia Evaluation  Patient identified by MRN, date of birth, ID band Patient awake    Reviewed: Allergy & Precautions, H&P , NPO status , Patient's Chart, lab work & pertinent test results, reviewed documented beta blocker date and time   History of Anesthesia Complications (+) PONV and history of anesthetic complications  Airway Mallampati: II  TM Distance: >3 FB Neck ROM: full    Dental no notable dental hx. (+) Teeth Intact   Pulmonary neg pulmonary ROS, former smoker   Pulmonary exam normal breath sounds clear to auscultation       Cardiovascular Exercise Tolerance: Good hypertension, On Medications negative cardio ROS  Rhythm:regular Rate:Normal     Neuro/Psych  PSYCHIATRIC DISORDERS  Depression    negative neurological ROS     GI/Hepatic Neg liver ROS,GERD  Medicated,,  Endo/Other  negative endocrine ROSdiabetes, Well Controlled    Renal/GU Renal disease     Musculoskeletal   Abdominal   Peds  Hematology negative hematology ROS (+)   Anesthesia Other Findings   Reproductive/Obstetrics negative OB ROS                              Anesthesia Physical Anesthesia Plan  ASA: 2  Anesthesia Plan: MAC   Post-op Pain Management:    Induction:   PONV Risk Score and Plan:   Airway Management Planned:   Additional Equipment:   Intra-op Plan:   Post-operative Plan:   Informed Consent: I have reviewed the patients History and Physical, chart, labs and discussed the procedure including the risks, benefits and alternatives for the proposed anesthesia with the patient or authorized representative who has indicated his/her understanding and acceptance.       Plan Discussed with: CRNA  Anesthesia Plan Comments:         Anesthesia Quick Evaluation

## 2023-10-03 NOTE — Transfer of Care (Signed)
 Immediate Anesthesia Transfer of Care Note  Patient: Carolyn Lowe  Procedure(s) Performed: PHACOEMULSIFICATION, CATARACT, WITH IOL INSERTION 3.60 00:32.0 (Left: Eye)  Patient Location: PACU  Anesthesia Type:MAC  Level of Consciousness: awake and alert   Airway & Oxygen Therapy: Patient Spontanous Breathing and Patient connected to nasal cannula oxygen  Post-op Assessment: Report given to RN and Post -op Vital signs reviewed and stable  Post vital signs: Reviewed and stable  Last Vitals:  Vitals Value Taken Time  BP    Temp    Pulse    Resp    SpO2      Last Pain:  Vitals:   10/03/23 0852  TempSrc: Temporal  PainSc: 0-No pain         Complications: No notable events documented.

## 2023-10-04 ENCOUNTER — Encounter: Payer: Self-pay | Admitting: Ophthalmology

## 2023-10-12 ENCOUNTER — Other Ambulatory Visit: Payer: Self-pay | Admitting: Internal Medicine

## 2023-10-12 DIAGNOSIS — F324 Major depressive disorder, single episode, in partial remission: Secondary | ICD-10-CM

## 2023-10-12 DIAGNOSIS — F5101 Primary insomnia: Secondary | ICD-10-CM

## 2023-10-12 NOTE — Telephone Encounter (Signed)
 Requested Prescriptions  Pending Prescriptions Disp Refills   mirtazapine  (REMERON ) 15 MG tablet [Pharmacy Med Name: MIRTAZAPINE  15 MG TABLET] 90 tablet 1    Sig: TAKE 1 TABLET BY MOUTH EVERYDAY AT BEDTIME     Psychiatry: Antidepressants - mirtazapine  Passed - 10/12/2023  5:49 PM      Passed - Completed PHQ-2 or PHQ-9 in the last 360 days      Passed - Valid encounter within last 6 months    Recent Outpatient Visits           1 month ago Essential hypertension   [place] [facility] at [facility], Leita DEL, MD       Future Appointments             In 5 months Justus, Leita DEL, MD [facility] at [facility], [facility]

## 2023-10-15 NOTE — Discharge Instructions (Signed)

## 2023-10-17 ENCOUNTER — Other Ambulatory Visit: Payer: Self-pay

## 2023-10-17 ENCOUNTER — Encounter
Admission: RE | Disposition: A | Discharge status: Home / Self Care | Payer: Self-pay | Source: Home / Self Care | Attending: Ophthalmology

## 2023-10-17 ENCOUNTER — Ambulatory Visit: Payer: Self-pay | Admitting: Anesthesiology

## 2023-10-17 ENCOUNTER — Encounter: Payer: Self-pay | Admitting: Ophthalmology

## 2023-10-17 ENCOUNTER — Ambulatory Visit
Admission: RE | Admit: 2023-10-17 | Discharge: 2023-10-17 | Disposition: A | Discharge status: Home / Self Care | Attending: Ophthalmology | Admitting: Ophthalmology

## 2023-10-17 DIAGNOSIS — H2511 Age-related nuclear cataract, right eye: Secondary | ICD-10-CM | POA: Diagnosis present

## 2023-10-17 DIAGNOSIS — K219 Gastro-esophageal reflux disease without esophagitis: Secondary | ICD-10-CM | POA: Insufficient documentation

## 2023-10-17 DIAGNOSIS — N1831 Chronic kidney disease, stage 3a: Secondary | ICD-10-CM | POA: Diagnosis not present

## 2023-10-17 DIAGNOSIS — I129 Hypertensive chronic kidney disease with stage 1 through stage 4 chronic kidney disease, or unspecified chronic kidney disease: Secondary | ICD-10-CM | POA: Insufficient documentation

## 2023-10-17 DIAGNOSIS — Z87891 Personal history of nicotine dependence: Secondary | ICD-10-CM | POA: Diagnosis not present

## 2023-10-17 DIAGNOSIS — F32A Depression, unspecified: Secondary | ICD-10-CM | POA: Diagnosis not present

## 2023-10-17 HISTORY — DX: Chronic kidney disease, stage 3a: N18.31

## 2023-10-17 HISTORY — DX: Mild cognitive impairment of uncertain or unknown etiology: G31.84

## 2023-10-17 HISTORY — PX: CATARACT EXTRACTION W/PHACO: SHX586

## 2023-10-17 HISTORY — DX: Other ill-defined heart diseases: I51.89

## 2023-10-17 SURGERY — PHACOEMULSIFICATION, CATARACT, WITH IOL INSERTION
Anesthesia: Monitor Anesthesia Care | Site: Eye | Laterality: Right

## 2023-10-17 MED ORDER — ARMC OPHTHALMIC DILATING DROPS
OPHTHALMIC | Status: AC
  Filled 2023-10-17: qty 0.5

## 2023-10-17 MED ORDER — FENTANYL CITRATE (PF) 100 MCG/2ML IJ SOLN
INTRAMUSCULAR | Status: DC | PRN
  Administered 2023-10-17 (×2): 50 ug via INTRAVENOUS

## 2023-10-17 MED ORDER — TETRACAINE HCL 0.5 % OP SOLN
OPHTHALMIC | Status: AC
  Filled 2023-10-17: qty 4

## 2023-10-17 MED ORDER — SIGHTPATH DOSE#1 NA HYALUR & NA CHOND-NA HYALUR IO KIT
PACK | INTRAOCULAR | Status: DC | PRN
  Administered 2023-10-17: 1 via OPHTHALMIC

## 2023-10-17 MED ORDER — TETRACAINE HCL 0.5 % OP SOLN
1.0000 [drp] | OPHTHALMIC | Status: DC | PRN
  Administered 2023-10-17 (×3): 1 [drp] via OPHTHALMIC

## 2023-10-17 MED ORDER — LACTATED RINGERS IV SOLN: INTRAVENOUS | Status: DC

## 2023-10-17 MED ORDER — ARMC OPHTHALMIC DILATING DROPS
1.0000 | OPHTHALMIC | Status: DC | PRN
  Administered 2023-10-17 (×3): 1 via OPHTHALMIC

## 2023-10-17 MED ORDER — MIDAZOLAM HCL 5 MG/5ML IJ SOLN
INTRAMUSCULAR | Status: DC | PRN
  Administered 2023-10-17 (×2): 1 mg via INTRAVENOUS

## 2023-10-17 MED ORDER — SIGHTPATH DOSE#1 BSS IO SOLN
INTRAOCULAR | Status: DC | PRN
  Administered 2023-10-17: 49 mL via OPHTHALMIC

## 2023-10-17 MED ORDER — LIDOCAINE HCL (PF) 2 % IJ SOLN
INTRAOCULAR | Status: DC | PRN
  Administered 2023-10-17: 2 mL

## 2023-10-17 MED ORDER — BRIMONIDINE TARTRATE-TIMOLOL 0.2-0.5 % OP SOLN
OPHTHALMIC | Status: DC | PRN
  Administered 2023-10-17: 1 [drp] via OPHTHALMIC

## 2023-10-17 MED ORDER — CEFUROXIME OPHTHALMIC INJECTION 1 MG/0.1 ML
INJECTION | OPHTHALMIC | Status: DC | PRN
  Administered 2023-10-17: 1 mg via INTRACAMERAL

## 2023-10-17 MED ORDER — SIGHTPATH DOSE#1 BSS IO SOLN
INTRAOCULAR | Status: DC | PRN
  Administered 2023-10-17: 15 mL via INTRAOCULAR

## 2023-10-17 MED ORDER — FENTANYL CITRATE (PF) 100 MCG/2ML IJ SOLN
INTRAMUSCULAR | Status: AC
Start: 2023-10-17 — End: 2023-10-17
  Filled 2023-10-17: qty 2

## 2023-10-17 MED ORDER — MIDAZOLAM HCL 2 MG/2ML IJ SOLN
INTRAMUSCULAR | Status: AC
  Filled 2023-10-17: qty 2

## 2023-10-17 SURGICAL SUPPLY — 9 items
CATARACT SUITE SIGHTPATH (MISCELLANEOUS) ×1 IMPLANT
FEE CATARACT SUITE SIGHTPATH (MISCELLANEOUS) ×1 IMPLANT
GLOVE BIOGEL PI IND STRL 8 (GLOVE) ×1 IMPLANT
GLOVE SURG LX STRL 7.5 STRW (GLOVE) ×1 IMPLANT
GLOVE SURG SYN 6.5 PF PI BL (GLOVE) ×1 IMPLANT
LENS IOL TECNIS EYHANCE 22.0 (Intraocular Lens) IMPLANT
NDL FILTER BLUNT 18X1 1/2 (NEEDLE) ×1 IMPLANT
NEEDLE FILTER BLUNT 18X1 1/2 (NEEDLE) ×1 IMPLANT
SYR 3ML LL SCALE MARK (SYRINGE) ×1 IMPLANT

## 2023-10-17 NOTE — Anesthesia Preprocedure Evaluation (Addendum)
 Anesthesia Evaluation  Patient identified by MRN, date of birth, ID band Patient awake    Reviewed: Allergy & Precautions, H&P , NPO status , Patient's Chart, lab work & pertinent test results  History of Anesthesia Complications (+) PONV and history of anesthetic complications  Airway Mallampati: III  TM Distance: <3 FB Neck ROM: Full    Dental no notable dental hx. (+) Chipped Chipped left upper central and left upper lateral incisors. Very small chips:   Pulmonary former smoker   Pulmonary exam normal breath sounds clear to auscultation       Cardiovascular hypertension, Normal cardiovascular exam Rhythm:Regular Rate:Normal  02-28-16 echo - Left ventricle: The cavity size was normal. Wall thickness was    normal. Systolic function was vigorous. The estimated ejection    fraction was in the range of 65% to 70%. Wall motion was normal;    there were no regional wall motion abnormalities. Doppler    parameters are consistent with abnormal left ventricular    relaxation (grade 1 diastolic dysfunction). The E/e&' ratio is    >15, suggesting elevated LV filling pressure.  - Left atrium: The atrium was normal in size.  - Tricuspid valve: There was trivial regurgitation.  - Pulmonary arteries: PA peak pressure: 12 mm Hg (S).  - Inferior vena cava: The vessel was normal in size. The    respirophasic diameter changes were in the normal range (>= 50%),    consistent with normal central venous pressure.     Neuro/Psych  PSYCHIATRIC DISORDERS  Depression    negative neurological ROS  negative psych ROS   GI/Hepatic negative GI ROS, Neg liver ROS,GERD  ,,  Endo/Other  negative endocrine ROS    Renal/GU Renal diseasenegative Renal ROS  negative genitourinary   Musculoskeletal negative musculoskeletal ROS (+) Arthritis ,    Abdominal   Peds negative pediatric ROS (+)  Hematology negative hematology ROS (+)   Anesthesia  Other Findings Previous cataract 10-03-23 Dr. Myra Breast cancer Dr [facility])  Hypertension GERD (gastroesophageal reflux disease) Colon polyp, hyperplastic Right trigger finger Hypercholesterolemia Brain aneurysm that self sealed Grade I diastolic dysfunction Depression Arthritis  Personal history of radiation therapy PONV (postoperative nausea and vomiting)  Mild cognitive impairment GERD (gastroesophageal reflux disease)     Reproductive/Obstetrics negative OB ROS                              Anesthesia Physical Anesthesia Plan  ASA: 3  Anesthesia Plan: MAC   Post-op Pain Management:    Induction: Intravenous  PONV Risk Score and Plan:   Airway Management Planned: Natural Airway and Nasal Cannula  Additional Equipment:   Intra-op Plan:   Post-operative Plan:   Informed Consent: I have reviewed the patients History and Physical, chart, labs and discussed the procedure including the risks, benefits and alternatives for the proposed anesthesia with the patient or authorized representative who has indicated his/her understanding and acceptance.     Dental Advisory Given  Plan Discussed with: Anesthesiologist, CRNA and Surgeon  Anesthesia Plan Comments: (Patient consented for risks of anesthesia including but not limited to:  - adverse reactions to medications - damage to eyes, teeth, lips or other oral mucosa - nerve damage due to positioning  - sore throat or hoarseness - Damage to heart, brain, nerves, lungs, other parts of body or loss of life  Patient voiced understanding and assent.)        Anesthesia Quick Evaluation

## 2023-10-17 NOTE — Op Note (Signed)
 LOCATION:  [facility]   PREOPERATIVE DIAGNOSIS:    Nuclear sclerotic cataract right eye. H25.11   POSTOPERATIVE DIAGNOSIS:  Nuclear sclerotic cataract right eye.     PROCEDURE:  Phacoemusification with posterior chamber intraocular lens placement of the right eye   ULTRASOUND TIME: Procedure(s): PHACOEMULSIFICATION, CATARACT, WITH IOL INSERTION 3.04 00:22.9 (Right)  LENS:   Implant Name Type Inv. Item Serial No. Manufacturer Lot No. LRB No. Used Action  LENS IOL TECNIS EYHANCE 22.0 - [id number] Intraocular Lens LENS IOL TECNIS EYHANCE 22.0 [id number] [employer]  Right 1 Implanted         SURGEON:  Dene FABIENE Etienne, MD   ANESTHESIA:  Topical with tetracaine  drops and 2% Xylocaine  jelly, augmented with 1% preservative-free intracameral lidocaine .    COMPLICATIONS:  None.   DESCRIPTION OF PROCEDURE:  The patient was identified in the holding room and transported to the operating room and placed in the supine position under the operating microscope.  The right eye was identified as the operative eye and it was prepped and draped in the usual sterile ophthalmic fashion.   A 1 millimeter clear-corneal paracentesis was made at the 12:00 position.  0.5 ml of preservative-free 1% lidocaine  was injected into the anterior chamber. The anterior chamber was filled with Viscoat viscoelastic.  A 2.4 millimeter keratome was used to make a near-clear corneal incision at the 9:00 position.  A curvilinear capsulorrhexis was made with a cystotome and capsulorrhexis forceps.  Balanced salt solution was used to hydrodissect and hydrodelineate the nucleus.   Phacoemulsification was then used in stop and chop fashion to remove the lens nucleus and epinucleus.  The remaining cortex was then removed using the irrigation and aspiration handpiece. Provisc was then placed into the capsular bag to distend it for lens placement.  A lens was then injected into the capsular bag.  The remaining  viscoelastic was aspirated.   Wounds were hydrated with balanced salt solution.  The anterior chamber was inflated to a physiologic pressure with balanced salt solution.  No wound leaks were noted. Cefuroxime  0.1 ml of a 10mg /ml solution was injected into the anterior chamber for a dose of 1 mg of intracameral antibiotic at the completion of the case.   Timolol  and Brimonidine  drops were applied to the eye.  The patient was taken to the recovery room in stable condition without complications of anesthesia or surgery.   Carolyn Lowe 10/17/2023, 9:39 AM

## 2023-10-17 NOTE — Transfer of Care (Signed)
 Immediate Anesthesia Transfer of Care Note  Patient: Carolyn Lowe  Procedure(s) Performed: PHACOEMULSIFICATION, CATARACT, WITH IOL INSERTION (Right: Eye)  Patient Location: PACU  Anesthesia Type: MAC  Level of Consciousness: awake, alert  and patient cooperative  Airway and Oxygen Therapy: Patient Spontanous Breathing and Patient connected to supplemental oxygen  Post-op Assessment: Post-op Vital signs reviewed, Patient's Cardiovascular Status Stable, Respiratory Function Stable, Patent Airway and No signs of Nausea or vomiting  Post-op Vital Signs: Reviewed and stable  Complications: No notable events documented.

## 2023-10-17 NOTE — H&P (Signed)
 [facility]   Primary Care Physician:  Justus Leita DEL, MD Ophthalmologist: Dr. Dene Etienne  Pre-Procedure History & Physical: HPI:  Carolyn Lowe is a 73 y.o. female here for ophthalmic surgery.   Past Medical History:  Diagnosis Date   Arthritis    Brain aneurysm    Resolved spontaneously- IT SEALED IT SELF - PT WAS IN HER EARLY 30'S    Breast cancer (HCC)    CKD stage 3a, GFR 45-59 ml/min (HCC)    Colon polyp, hyperplastic    Depression    DEPRESSION RELATED TO HX BREAST CANCER/ ARIMEDEX SIDE EFFECTS- NO LONGER ON ARIMEDEX - AND DOING BETTER   GERD (gastroesophageal reflux disease)    GERD (gastroesophageal reflux disease)    Grade I diastolic dysfunction    Hypercholesterolemia    Hypertension    Mild cognitive impairment    Personal history of radiation therapy    PONV (postoperative nausea and vomiting)    Right trigger finger     Past Surgical History:  Procedure Laterality Date   ABDOMINAL HYSTERECTOMY  1984   BREAST LUMPECTOMY Left 2010   BREAST LUMPECTOMY Right 2021   CARPAL TUNNEL RELEASE Right 2013   CATARACT EXTRACTION W/PHACO Left 10/03/2023   Procedure: PHACOEMULSIFICATION, CATARACT, WITH IOL INSERTION 3.60 00:32.0;  Surgeon: Etienne Dene, MD;  Location: [facility] ORS;  Service: Ophthalmology;  Laterality: Left;   CHOLECYSTECTOMY  1999   INCISIONAL HERNIA REPAIR N/A 10/22/2013   Procedure: LAPAROSCOPIC INCISIONAL HERNIA;  Surgeon: Vicenta Carolyn Poli, MD;  Location: [facility] ORS;  Service: General;  Laterality: N/A;   INSERTION OF MESH N/A 10/22/2013   Procedure: INSERTION OF MESH;  Surgeon: Vicenta Carolyn Poli, MD;  Location: [facility] ORS;  Service: General;  Laterality: N/A;   NASAL SINUS SURGERY  1987   right knee arthroscopy Right 2005    Prior to Admission medications   Medication Sig Start Date End Date Taking? Authorizing Provider  anastrozole (ARIMIDEX) 1 MG tablet Take 1 mg by mouth daily. 11/06/19  Yes [provider]   aspirin  EC 81 MG tablet Take 81 mg by mouth daily.   Yes [provider]  buPROPion  (WELLBUTRIN  XL) 150 MG 24 hr tablet TAKE 1 TABLET BY MOUTH EVERY DAY 09/04/23  Yes Justus Leita DEL, MD  donepezil  (ARICEPT ) 10 MG tablet Take 1 tablet (10 mg total) by mouth at bedtime. 03/30/23  Yes Justus Leita DEL, MD  hydrochlorothiazide  (HYDRODIURIL ) 12.5 MG tablet TAKE 1 TABLET BY MOUTH EVERY DAY 09/04/23  Yes Berglund, Laura H, MD  ipratropium (ATROVENT) 0.06 % nasal spray Place 2 sprays into both nostrils 2 (two) times daily. 07/27/23  Yes [provider]  lisinopril  (ZESTRIL ) 30 MG tablet TAKE 1 TABLET BY MOUTH EVERY DAY 08/28/23  Yes Justus Leita DEL, MD  meloxicam (MOBIC) 15 MG tablet Take 15 mg by mouth daily.   Yes [provider]  metoprolol  succinate (TOPROL -XL) 25 MG 24 hr tablet TAKE 1 TABLET (25 MG TOTAL) BY MOUTH DAILY. 09/11/23  Yes Justus Leita DEL, MD  mirtazapine  (REMERON ) 15 MG tablet TAKE 1 TABLET BY MOUTH EVERYDAY AT BEDTIME 10/12/23  Yes Berglund, Laura H, MD  Misc Natural Products (TART CHERRY ADVANCED PO) Take by mouth.   Yes [provider]  Multiple Vitamin (MULTIVITAMIN WITH MINERALS) TABS tablet Take 1 tablet by mouth daily.   Yes [provider]  pantoprazole  (PROTONIX ) 40 MG tablet TAKE 1 TABLET BY MOUTH EVERYDAY AT BEDTIME 09/11/23  Yes Justus Leita DEL,  MD  simvastatin  (ZOCOR ) 40 MG tablet TAKE 1 TABLET BY MOUTH EVERYDAY AT BEDTIME 08/28/23  Yes Berglund, Laura H, MD  vitamin E 1000 UNIT capsule Take 1,000 Units by mouth daily.   Yes [provider]  nitroGLYCERIN (NITROSTAT) 0.4 MG SL tablet Place 0.4 mg under the tongue every 5 (five) minutes as needed.    [provider]    Allergies as of 08/22/2023   (No Known Allergies)    Family History  Problem Relation Age of Onset   Heart attack Mother 87   Atrial fibrillation Mother    Heart disease Father        CABG 4V, two stents since   Heart disease Paternal  Grandfather    Breast cancer Neg Hx     Social History   Socioeconomic History   Marital status: Divorced    Spouse name: Not on file   Number of children: 1   Years of education: Not on file   Highest education level: Not on file  Occupational History   Occupation: retired  Tobacco Use   Smoking status: Former    Current packs/day: 0.00    Average packs/day: 1 pack/day for 39.0 years (39.0 ttl pk-yrs)    Types: Cigarettes    Start date: 62    Quit date: 2010    Years since quitting: 15.5    Passive exposure: Past   Smokeless tobacco: Never  Vaping Use   Vaping status: Never Used  Substance and Sexual Activity   Alcohol use: Yes    Alcohol/week: 7.0 - 14.0 standard drinks of alcohol    Types: 7 - 14 Standard drinks or equivalent per week    Comment: 1-2 drinks every night   Drug use: No   Sexual activity: Not Currently    Birth control/protection: None  Other Topics Concern   Not on file  Social History Narrative   Not on file   Social Drivers of Health   Financial Resource Strain: Low Risk  (08/29/2023)   Overall Financial Resource Strain (CARDIA)    Difficulty of Paying Living Expenses: Not very hard  Food Insecurity: No Food Insecurity (08/29/2023)   Hunger Vital Sign    Worried About Running Out of Food in the Last Year: Never true    Ran Out of Food in the Last Year: Never true  Transportation Needs: No Transportation Needs (08/29/2023)   PRAPARE - Administrator, Civil Service (Medical): No    Lack of Transportation (Non-Medical): No  Physical Activity: Inactive (08/29/2023)   Exercise Vital Sign    Days of Exercise per Week: 0 days    Minutes of Exercise per Session: 0 min  Stress: No Stress Concern Present (08/29/2023)   [employer] of Occupational Health - Occupational Stress Questionnaire    Feeling of Stress : Not at all  Social Connections: Socially Isolated (08/29/2023)   Social Connection and Isolation Panel    Frequency of  Communication with Friends and Family: More than three times a week    Frequency of Social Gatherings with Friends and Family: Three times a week    Attends Religious Services: Never    Active Member of Clubs or Organizations: No    Attends Banker Meetings: Never    Marital Status: Divorced  Catering manager Violence: Not At Risk (08/29/2023)   Humiliation, Afraid, Rape, and Kick questionnaire    Fear of Current or Ex-Partner: No    Emotionally Abused: No  Physically Abused: No    Sexually Abused: No    Review of Systems: See HPI, otherwise negative ROS  Physical Exam: BP (!) 150/70   Temp 98 F (36.7 C) (Temporal)   Ht 5' 0.98 (1.549 m)   Wt 56.6 kg   SpO2 99%   BMI 23.59 kg/m  General:   Alert,  pleasant and cooperative in NAD Head:  Normocephalic and atraumatic. Lungs:  Clear to auscultation.    Heart:  Regular rate and rhythm.   Impression/Plan: Carolyn Lowe is here for ophthalmic surgery.  Risks, benefits, limitations, and alternatives regarding ophthalmic surgery have been reviewed with the patient.  Questions have been answered.  All parties agreeable.   Carolyn GASKIN, MD  10/17/2023, 9:17 AM

## 2023-10-18 ENCOUNTER — Encounter: Payer: Self-pay | Admitting: Ophthalmology

## 2023-10-18 NOTE — Anesthesia Postprocedure Evaluation (Signed)
 Anesthesia Post Note  Patient: Carolyn Lowe  Procedure(s) Performed: PHACOEMULSIFICATION, CATARACT, WITH IOL INSERTION 3.04 00:22.9 (Right: Eye)  Patient location during evaluation: PACU Anesthesia Type: MAC Level of consciousness: awake and alert Pain management: pain level controlled Vital Signs Assessment: post-procedure vital signs reviewed and stable Respiratory status: spontaneous breathing, nonlabored ventilation, respiratory function stable and patient connected to nasal cannula oxygen Cardiovascular status: stable and blood pressure returned to baseline Postop Assessment: no apparent nausea or vomiting Anesthetic complications: no   No notable events documented.   Last Vitals:  Vitals:   10/17/23 0940 10/17/23 0945  BP: 135/70 128/69  Pulse: (!) 46 (!) 47  Resp: 13 20  Temp: (!) 36.2 C (!) 36.2 C  SpO2: 95% 97%    Last Pain:  Vitals:   10/18/23 0953  TempSrc:   PainSc: 0-No pain                 Donny JAYSON Mu

## 2023-11-21 DIAGNOSIS — R600 Localized edema: Secondary | ICD-10-CM | POA: Diagnosis not present

## 2023-11-23 DIAGNOSIS — M25511 Pain in right shoulder: Secondary | ICD-10-CM | POA: Diagnosis not present

## 2023-11-23 DIAGNOSIS — R600 Localized edema: Secondary | ICD-10-CM | POA: Diagnosis not present

## 2023-11-24 ENCOUNTER — Other Ambulatory Visit: Payer: Self-pay | Admitting: Internal Medicine

## 2023-11-24 DIAGNOSIS — I10 Essential (primary) hypertension: Secondary | ICD-10-CM

## 2023-11-26 DIAGNOSIS — M25511 Pain in right shoulder: Secondary | ICD-10-CM | POA: Diagnosis not present

## 2023-11-26 DIAGNOSIS — R600 Localized edema: Secondary | ICD-10-CM | POA: Diagnosis not present

## 2023-11-26 NOTE — Telephone Encounter (Signed)
 Requested Prescriptions  Pending Prescriptions Disp Refills   lisinopril  (ZESTRIL ) 30 MG tablet [Pharmacy Med Name: LISINOPRIL  30 MG TABLET] 90 tablet 1    Sig: TAKE 1 TABLET BY MOUTH EVERY DAY     Cardiovascular:  ACE Inhibitors Failed - 11/26/2023  1:21 PM      Failed - Cr in normal range and within 180 days    Creatinine  Date Value Ref Range Status  01/07/2014 1.0 0.6 - 1.1 mg/dL Final   Creatinine, Ser  Date Value Ref Range Status  09/10/2023 1.32 (H) 0.57 - 1.00 mg/dL Final         Passed - K in normal range and within 180 days    Potassium  Date Value Ref Range Status  09/10/2023 5.1 3.5 - 5.2 mmol/L Final  01/07/2014 4.2 3.5 - 5.1 mEq/L Final         Passed - Patient is not pregnant      Passed - Last BP in normal range    BP Readings from Last 1 Encounters:  10/17/23 128/69         Passed - Valid encounter within last 6 months    Recent Outpatient Visits           2 months ago Essential hypertension   [place] [facility] at [facility], Leita DEL, MD       Future Appointments             In 3 months Justus Leita DEL, MD [facility] at [facility], [phone] [place]

## 2023-11-29 ENCOUNTER — Other Ambulatory Visit: Payer: Self-pay | Admitting: Internal Medicine

## 2023-11-29 DIAGNOSIS — F324 Major depressive disorder, single episode, in partial remission: Secondary | ICD-10-CM

## 2023-11-29 DIAGNOSIS — I10 Essential (primary) hypertension: Secondary | ICD-10-CM

## 2023-11-29 NOTE — Telephone Encounter (Signed)
 Requested Prescriptions  Pending Prescriptions Disp Refills   buPROPion  (WELLBUTRIN  XL) 150 MG 24 hr tablet [Pharmacy Med Name: BUPROPION  HCL XL 150 MG TABLET] 90 tablet 0    Sig: TAKE 1 TABLET BY MOUTH EVERY DAY     Psychiatry: Antidepressants - bupropion  Failed - 11/29/2023  5:16 PM      Failed - Cr in normal range and within 360 days    Creatinine  Date Value Ref Range Status  01/07/2014 1.0 0.6 - 1.1 mg/dL Final   Creatinine, Ser  Date Value Ref Range Status  09/10/2023 1.32 (H) 0.57 - 1.00 mg/dL Final         Passed - AST in normal range and within 360 days    AST  Date Value Ref Range Status  09/10/2023 19 0 - 40 IU/L Final  01/07/2014 25 5 - 34 U/L Final         Passed - ALT in normal range and within 360 days    ALT  Date Value Ref Range Status  09/10/2023 14 0 - 32 IU/L Final  01/07/2014 32 0 - 55 U/L Final         Passed - Completed PHQ-2 or PHQ-9 in the last 360 days      Passed - Last BP in normal range    BP Readings from Last 1 Encounters:  10/17/23 128/69         Passed - Valid encounter within last 6 months    Recent Outpatient Visits           2 months ago Essential hypertension   [place] [facility] at [facility], Leita DEL, MD       Future Appointments             In 3 months Justus Leita DEL, MD [facility] at [facility] , [phone] [place]             hydrochlorothiazide  (HYDRODIURIL ) 12.5 MG tablet [Pharmacy Med Name: HYDROCHLOROTHIAZIDE  12.5 MG TB] 90 tablet 0    Sig: TAKE 1 TABLET BY MOUTH EVERY DAY     Cardiovascular: Diuretics - Thiazide Failed - 11/29/2023  5:16 PM      Failed - Cr in normal range and within 180 days    Creatinine  Date Value Ref Range Status  01/07/2014 1.0 0.6 - 1.1 mg/dL Final   Creatinine, Ser  Date Value Ref Range Status  09/10/2023 1.32 (H) 0.57 - 1.00 mg/dL Final         Passed - K in normal range and within 180 days    Potassium   Date Value Ref Range Status  09/10/2023 5.1 3.5 - 5.2 mmol/L Final  01/07/2014 4.2 3.5 - 5.1 mEq/L Final         Passed - Na in normal range and within 180 days    Sodium  Date Value Ref Range Status  09/10/2023 142 134 - 144 mmol/L Final  01/07/2014 140 136 - 145 mEq/L Final         Passed - Last BP in normal range    BP Readings from Last 1 Encounters:  10/17/23 128/69         Passed - Valid encounter within last 6 months    Recent Outpatient Visits           2 months ago Essential hypertension   [facility] at [facility], Leita  H, MD       Future Appointments             In 3 months Justus, Leita DEL, MD [facility] at [facility], [phone] [place]

## 2023-12-04 DIAGNOSIS — R600 Localized edema: Secondary | ICD-10-CM | POA: Diagnosis not present

## 2023-12-04 DIAGNOSIS — M25511 Pain in right shoulder: Secondary | ICD-10-CM | POA: Diagnosis not present

## 2023-12-06 ENCOUNTER — Other Ambulatory Visit: Payer: Self-pay | Admitting: Internal Medicine

## 2023-12-06 NOTE — Telephone Encounter (Signed)
 Requested Prescriptions  Refused Prescriptions Disp Refills   donepezil  (ARICEPT ) 5 MG tablet [Pharmacy Med Name: DONEPEZIL  HCL 5 MG TABLET] 90 tablet 1    Sig: TAKE 1 TABLET BY MOUTH EVERYDAY AT BEDTIME     Neurology:  Alzheimer's Agents Passed - 12/06/2023  4:17 PM      Passed - Valid encounter within last 6 months    Recent Outpatient Visits           2 months ago Essential hypertension   [place] [facility] at [facility], Leita DEL, MD       Future Appointments             In 3 months Justus, Leita DEL, MD [facility] at [facility], [phone] [place]

## 2023-12-07 DIAGNOSIS — M25511 Pain in right shoulder: Secondary | ICD-10-CM | POA: Diagnosis not present

## 2023-12-07 DIAGNOSIS — R600 Localized edema: Secondary | ICD-10-CM | POA: Diagnosis not present

## 2023-12-11 DIAGNOSIS — R600 Localized edema: Secondary | ICD-10-CM | POA: Diagnosis not present

## 2023-12-11 DIAGNOSIS — M25511 Pain in right shoulder: Secondary | ICD-10-CM | POA: Diagnosis not present

## 2023-12-13 DIAGNOSIS — R600 Localized edema: Secondary | ICD-10-CM | POA: Diagnosis not present

## 2023-12-13 DIAGNOSIS — M25511 Pain in right shoulder: Secondary | ICD-10-CM | POA: Diagnosis not present

## 2023-12-19 DIAGNOSIS — R600 Localized edema: Secondary | ICD-10-CM | POA: Diagnosis not present

## 2023-12-19 DIAGNOSIS — M25511 Pain in right shoulder: Secondary | ICD-10-CM | POA: Diagnosis not present

## 2023-12-21 DIAGNOSIS — M25511 Pain in right shoulder: Secondary | ICD-10-CM | POA: Diagnosis not present

## 2023-12-21 DIAGNOSIS — R600 Localized edema: Secondary | ICD-10-CM | POA: Diagnosis not present

## 2023-12-27 DIAGNOSIS — R600 Localized edema: Secondary | ICD-10-CM | POA: Diagnosis not present

## 2023-12-27 DIAGNOSIS — M25511 Pain in right shoulder: Secondary | ICD-10-CM | POA: Diagnosis not present

## 2024-01-01 DIAGNOSIS — R600 Localized edema: Secondary | ICD-10-CM | POA: Diagnosis not present

## 2024-01-01 DIAGNOSIS — M25511 Pain in right shoulder: Secondary | ICD-10-CM | POA: Diagnosis not present

## 2024-01-03 DIAGNOSIS — M25511 Pain in right shoulder: Secondary | ICD-10-CM | POA: Diagnosis not present

## 2024-01-03 DIAGNOSIS — R600 Localized edema: Secondary | ICD-10-CM | POA: Diagnosis not present

## 2024-01-08 DIAGNOSIS — R600 Localized edema: Secondary | ICD-10-CM | POA: Diagnosis not present

## 2024-01-08 DIAGNOSIS — M25511 Pain in right shoulder: Secondary | ICD-10-CM | POA: Diagnosis not present

## 2024-01-10 DIAGNOSIS — R600 Localized edema: Secondary | ICD-10-CM | POA: Diagnosis not present

## 2024-01-10 DIAGNOSIS — M25511 Pain in right shoulder: Secondary | ICD-10-CM | POA: Diagnosis not present

## 2024-01-15 DIAGNOSIS — M25511 Pain in right shoulder: Secondary | ICD-10-CM | POA: Diagnosis not present

## 2024-01-15 DIAGNOSIS — R600 Localized edema: Secondary | ICD-10-CM | POA: Diagnosis not present

## 2024-01-17 ENCOUNTER — Other Ambulatory Visit: Payer: Self-pay | Admitting: Internal Medicine

## 2024-01-17 DIAGNOSIS — R600 Localized edema: Secondary | ICD-10-CM | POA: Diagnosis not present

## 2024-01-17 DIAGNOSIS — M25511 Pain in right shoulder: Secondary | ICD-10-CM | POA: Diagnosis not present

## 2024-01-17 DIAGNOSIS — J309 Allergic rhinitis, unspecified: Secondary | ICD-10-CM

## 2024-01-18 NOTE — Telephone Encounter (Signed)
 Discontinued on 09/10/23.  Requested Prescriptions  Pending Prescriptions Disp Refills   Azelastine  HCl 137 MCG/SPRAY SOLN [Pharmacy Med Name: AZELASTINE  0.1% (137 MCG) SPRY]  4    Sig: PLACE 2 SPRAYS INTO BOTH NOSTRILS 2 (TWO) TIMES DAILY. USE IN EACH NOSTRIL AS DIRECTED     Ear, Nose, and Throat: Nasal Preparations - Antiallergy Passed - 01/18/2024  2:01 PM      Passed - Valid encounter within last 12 months    Recent Outpatient Visits           4 months ago Essential hypertension   [place] [facility] at [facility], Leita DEL, MD       Future Appointments             In 1 month Justus, Leita DEL, MD [facility] & [facility] Medicine at [facility], [phone] [place]

## 2024-01-21 DIAGNOSIS — M25511 Pain in right shoulder: Secondary | ICD-10-CM | POA: Diagnosis not present

## 2024-01-21 DIAGNOSIS — R600 Localized edema: Secondary | ICD-10-CM | POA: Diagnosis not present

## 2024-01-23 DIAGNOSIS — R600 Localized edema: Secondary | ICD-10-CM | POA: Diagnosis not present

## 2024-01-23 DIAGNOSIS — M25511 Pain in right shoulder: Secondary | ICD-10-CM | POA: Diagnosis not present

## 2024-01-28 DIAGNOSIS — R600 Localized edema: Secondary | ICD-10-CM | POA: Diagnosis not present

## 2024-01-28 DIAGNOSIS — M25511 Pain in right shoulder: Secondary | ICD-10-CM | POA: Diagnosis not present

## 2024-02-04 DIAGNOSIS — R600 Localized edema: Secondary | ICD-10-CM | POA: Diagnosis not present

## 2024-02-04 DIAGNOSIS — M25511 Pain in right shoulder: Secondary | ICD-10-CM | POA: Diagnosis not present

## 2024-02-06 DIAGNOSIS — M25511 Pain in right shoulder: Secondary | ICD-10-CM | POA: Diagnosis not present

## 2024-02-06 DIAGNOSIS — R600 Localized edema: Secondary | ICD-10-CM | POA: Diagnosis not present

## 2024-02-07 ENCOUNTER — Other Ambulatory Visit: Payer: Self-pay | Admitting: Internal Medicine

## 2024-02-07 DIAGNOSIS — E782 Mixed hyperlipidemia: Secondary | ICD-10-CM

## 2024-02-08 NOTE — Telephone Encounter (Signed)
 Requested Prescriptions  Pending Prescriptions Disp Refills   simvastatin  (ZOCOR ) 40 MG tablet [Pharmacy Med Name: SIMVASTATIN  40 MG TABLET] 90 tablet 0    Sig: TAKE 1 TABLET BY MOUTH EVERYDAY AT BEDTIME     Cardiovascular:  Antilipid - Statins Failed - 02/08/2024  4:35 PM      Failed - Lipid Panel in normal range within the last 12 months    Cholesterol, Total  Date Value Ref Range Status  03/09/2023 200 (H) 100 - 199 mg/dL Final   LDL Chol Calc (NIH)  Date Value Ref Range Status  03/09/2023 111 (H) 0 - 99 mg/dL Final   HDL  Date Value Ref Range Status  03/09/2023 54 >39 mg/dL Final   Triglycerides  Date Value Ref Range Status  03/09/2023 200 (H) 0 - 149 mg/dL Final         Passed - Patient is not pregnant      Passed - Valid encounter within last 12 months    Recent Outpatient Visits           5 months ago Essential hypertension   [place] [facility] at [facility], Leita DEL, MD       Future Appointments             In 1 month Justus, Leita DEL, MD [facility] Medicine at [facility], [phone] [place]

## 2024-02-12 DIAGNOSIS — M25511 Pain in right shoulder: Secondary | ICD-10-CM | POA: Diagnosis not present

## 2024-02-12 DIAGNOSIS — R600 Localized edema: Secondary | ICD-10-CM | POA: Diagnosis not present

## 2024-02-22 DIAGNOSIS — R600 Localized edema: Secondary | ICD-10-CM | POA: Diagnosis not present

## 2024-02-22 DIAGNOSIS — M25511 Pain in right shoulder: Secondary | ICD-10-CM | POA: Diagnosis not present

## 2024-02-25 DIAGNOSIS — M25511 Pain in right shoulder: Secondary | ICD-10-CM | POA: Diagnosis not present

## 2024-02-25 DIAGNOSIS — R600 Localized edema: Secondary | ICD-10-CM | POA: Diagnosis not present

## 2024-02-26 ENCOUNTER — Other Ambulatory Visit: Payer: Self-pay | Admitting: Internal Medicine

## 2024-02-26 DIAGNOSIS — I10 Essential (primary) hypertension: Secondary | ICD-10-CM

## 2024-02-26 DIAGNOSIS — F324 Major depressive disorder, single episode, in partial remission: Secondary | ICD-10-CM

## 2024-02-27 NOTE — Telephone Encounter (Signed)
 Requested Prescriptions  Pending Prescriptions Disp Refills   hydrochlorothiazide  (HYDRODIURIL ) 12.5 MG tablet [Pharmacy Med Name: HYDROCHLOROTHIAZIDE  12.5 MG TB] 90 tablet 0    Sig: TAKE 1 TABLET BY MOUTH EVERY DAY     Cardiovascular: Diuretics - Thiazide Failed - 02/27/2024  4:11 PM      Failed - Cr in normal range and within 180 days    Creatinine  Date Value Ref Range Status  01/07/2014 1.0 0.6 - 1.1 mg/dL Final   Creatinine, Ser  Date Value Ref Range Status  09/10/2023 1.32 (H) 0.57 - 1.00 mg/dL Final         Passed - K in normal range and within 180 days    Potassium  Date Value Ref Range Status  09/10/2023 5.1 3.5 - 5.2 mmol/L Final  01/07/2014 4.2 3.5 - 5.1 mEq/L Final         Passed - Na in normal range and within 180 days    Sodium  Date Value Ref Range Status  09/10/2023 142 134 - 144 mmol/L Final  01/07/2014 140 136 - 145 mEq/L Final         Passed - Last BP in normal range    BP Readings from Last 1 Encounters:  10/17/23 128/69         Passed - Valid encounter within last 6 months    Recent Outpatient Visits           5 months ago Essential hypertension   [place] [facility] at [facility], Leita DEL, MD       Future Appointments             In 2 weeks Justus Leita DEL, MD [facility] at [facility], [phone] [place]             buPROPion  (WELLBUTRIN  XL) 150 MG 24 hr tablet [Pharmacy Med Name: BUPROPION  HCL XL 150 MG TABLET] 90 tablet 0    Sig: TAKE 1 TABLET BY MOUTH EVERY DAY     Psychiatry: Antidepressants - bupropion  Failed - 02/27/2024  4:11 PM      Failed - Cr in normal range and within 360 days    Creatinine  Date Value Ref Range Status  01/07/2014 1.0 0.6 - 1.1 mg/dL Final   Creatinine, Ser  Date Value Ref Range Status  09/10/2023 1.32 (H) 0.57 - 1.00 mg/dL Final         Passed - AST in normal range and within 360 days    AST  Date Value Ref Range Status   09/10/2023 19 0 - 40 IU/L Final  01/07/2014 25 5 - 34 U/L Final         Passed - ALT in normal range and within 360 days    ALT  Date Value Ref Range Status  09/10/2023 14 0 - 32 IU/L Final  01/07/2014 32 0 - 55 U/L Final         Passed - Completed PHQ-2 or PHQ-9 in the last 360 days      Passed - Last BP in normal range    BP Readings from Last 1 Encounters:  10/17/23 128/69         Passed - Valid encounter within last 6 months    Recent Outpatient Visits           5 months ago Essential hypertension   [facility] at [facility], Leita  H, MD       Future Appointments             In 2 weeks Justus, Leita DEL, MD [facility] at [facility], [phone] [place]

## 2024-03-08 ENCOUNTER — Other Ambulatory Visit: Payer: Self-pay | Admitting: Internal Medicine

## 2024-03-08 DIAGNOSIS — K219 Gastro-esophageal reflux disease without esophagitis: Secondary | ICD-10-CM

## 2024-03-08 DIAGNOSIS — I10 Essential (primary) hypertension: Secondary | ICD-10-CM

## 2024-03-12 NOTE — Telephone Encounter (Signed)
 Requested Prescriptions  Pending Prescriptions Disp Refills   metoprolol  succinate (TOPROL -XL) 25 MG 24 hr tablet [Pharmacy Med Name: METOPROLOL  SUCC ER 25 MG TAB] 90 tablet 0    Sig: TAKE 1 TABLET (25 MG TOTAL) BY MOUTH DAILY.     Cardiovascular:  Beta Blockers Failed - 03/12/2024  9:19 AM      Failed - Valid encounter within last 6 months    Recent Outpatient Visits           6 months ago Essential hypertension   [place] [facility] at [facility], Leita DEL, MD       Future Appointments             In 5 days Justus Leita DEL, MD [facility] at [facility], [phone] [place]            Passed - Last BP in normal range    BP Readings from Last 1 Encounters:  10/17/23 128/69         Passed - Last Heart Rate in normal range    Pulse Readings from Last 1 Encounters:  10/17/23 (!) 47          pantoprazole  (PROTONIX ) 40 MG tablet [Pharmacy Med Name: PANTOPRAZOLE  SOD DR 40 MG TAB] 90 tablet 0    Sig: TAKE 1 TABLET BY MOUTH EVERYDAY AT BEDTIME     Gastroenterology: Proton Pump Inhibitors Passed - 03/12/2024  9:19 AM      Passed - Valid encounter within last 12 months    Recent Outpatient Visits           6 months ago Essential hypertension   [place] [facility] at [facility], Leita DEL, MD       Future Appointments             In 5 days Justus, Leita DEL, MD [facility] at [facility], [phone] [place]

## 2024-03-17 ENCOUNTER — Encounter: Payer: Self-pay | Admitting: Internal Medicine

## 2024-03-17 ENCOUNTER — Encounter: Admitting: Internal Medicine

## 2024-03-17 ENCOUNTER — Other Ambulatory Visit: Payer: Self-pay | Admitting: Internal Medicine

## 2024-03-17 DIAGNOSIS — G3184 Mild cognitive impairment, so stated: Secondary | ICD-10-CM

## 2024-03-17 NOTE — Telephone Encounter (Unsigned)
 Copied from CRM #[id number]. Topic: Clinical - Medication Refill >> Mar 17, 2024  3:21 PM Selinda RAMAN wrote: Medication: donepezil  (ARICEPT ) 10 MG tablet  Has the patient contacted their pharmacy? No   This is the patient's preferred pharmacy:  [facility] [phone] [address], [place] - [address] [place] [address] Phone: [phone] Fax: [phone]  Is this the correct pharmacy for this prescription? Yes If no, delete pharmacy and type the correct one.   Has the prescription been filled recently? No  Is the patient out of the medication? Yes and she has been out a couple of days  Has the patient been seen for an appointment in the last year OR does the patient have an upcoming appointment? Yes  Can we respond through MyChart? Yes  Please assist patient further

## 2024-03-17 NOTE — Progress Notes (Deleted)
 "   Date:  03/17/2024   Name:  Carolyn Lowe   DOB:  [birth year]   MRN:  [id number]   Chief Complaint: No chief complaint on file. Carolyn Lowe is a 73 y.o. female who presents today for her Complete Annual Exam. She feels {DESC; WELL/FAIRLY WELL/POORLY:18703}. She reports exercising ***. She reports she is sleeping {DESC; WELL/FAIRLY WELL/POORLY:18703}. Breast complaints ***.  Health Maintenance  Topic Date Due   Zoster (Shingles) Vaccine (1 of 2) Never done   Colon Cancer Screening  Never done   COVID-19 Vaccine (4 - 2025-26 season) 11/19/2023   Osteoporosis screening with Bone Density Scan  11/26/2023   Breast Cancer Screening  12/08/2023   Medicare Annual Wellness Visit  08/28/2024   DTaP/Tdap/Td vaccine (2 - Td or Tdap) 04/11/2028   Pneumococcal Vaccine for age over 45  Completed   Flu Shot  Completed   Hepatitis C Screening  Completed   Meningitis B Vaccine  Aged Out    Hypertension This is a chronic problem. The problem is controlled. Pertinent negatives include no chest pain, headaches, palpitations or shortness of breath. Past treatments include ACE inhibitors, beta blockers and diuretics. The current treatment provides significant improvement. Hypertensive end-organ damage includes kidney disease. There is no history of CAD/MI.  Gastroesophageal Reflux She complains of heartburn. She reports no abdominal pain, no chest pain, no coughing or no wheezing. This is a recurrent problem. The problem occurs rarely. Pertinent negatives include no fatigue. She has tried a PPI for the symptoms. The treatment provided significant relief.  Depression        This is a chronic problem.The problem is unchanged.  Associated symptoms include no fatigue, no myalgias and no headaches.  Treatments tried: Bupropion  and Remeron . Hyperlipidemia This is a chronic problem. The problem is controlled. Pertinent negatives include no chest pain, myalgias or shortness of breath. Current  antihyperlipidemic treatment includes statins.  Breast cancer - bilateral lumpectomies; on Arimidex.  Last MM 11/2022 at [facility].  Review of Systems  Constitutional:  Negative for fatigue and unexpected weight change.  HENT:  Negative for trouble swallowing.   Eyes:  Negative for visual disturbance.  Respiratory:  Negative for cough, chest tightness, shortness of breath and wheezing.   Cardiovascular:  Negative for chest pain, palpitations and leg swelling.  Gastrointestinal:  Positive for heartburn. Negative for abdominal pain, constipation and diarrhea.  Musculoskeletal:  Negative for arthralgias and myalgias.  Neurological:  Negative for dizziness, weakness, light-headedness and headaches.  Psychiatric/Behavioral:  Positive for depression.      Lab Results  Component Value Date   NA 142 09/10/2023   K 5.1 09/10/2023   CO2 23 09/10/2023   GLUCOSE 74 09/10/2023   BUN 22 09/10/2023   CREATININE 1.32 (H) 09/10/2023   CALCIUM 10.1 09/10/2023   EGFR 43 (L) 09/10/2023   GFRNONAA 46 (L) 04/04/2023   Lab Results  Component Value Date   CHOL 200 (H) 03/09/2023   HDL 54 03/09/2023   LDLCALC 111 (H) 03/09/2023   TRIG 200 (H) 03/09/2023   CHOLHDL 3.7 03/09/2023   No results found for: TSH No results found for: HGBA1C Lab Results  Component Value Date   WBC 8.9 04/04/2023   HGB 15.2 (H) 04/04/2023   HCT 43.9 04/04/2023   MCV 94.8 04/04/2023   PLT 291 04/04/2023   Lab Results  Component Value Date   ALT 14 09/10/2023   AST 19 09/10/2023   ALKPHOS 58 09/10/2023   BILITOT 0.5 09/10/2023  No results found for: MARIEN BOLLS, VD25OH   Patient Active Problem List   Diagnosis Date Noted   CKD stage 3a, GFR 45-59 ml/min (HCC) 08/21/2023   History of right breast cancer 12/27/2022   Major depression single episode, in partial remission 12/27/2022   GERD (gastroesophageal reflux disease) 12/27/2022   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   MCI (mild cognitive  impairment) 12/27/2022   Degenerative arthritis of knee, bilateral 12/12/2018   Arthritis of midfoot 04/16/2018   Chest pain with high risk for cardiac etiology 02/27/2016   Essential hypertension 02/27/2016   Mixed hyperlipidemia 02/27/2016   History of left breast cancer 01/14/2014   Insomnia 02/03/2013   Vulvar lesion 03/20/2005   S/P abdominal hysterectomy 03/20/1982    Allergies[1]  Past Surgical History:  Procedure Laterality Date   ABDOMINAL HYSTERECTOMY  1984   BREAST LUMPECTOMY Left 2010   BREAST LUMPECTOMY Right 2021   CARPAL TUNNEL RELEASE Right 2013   CATARACT EXTRACTION W/PHACO Left 10/03/2023   Procedure: PHACOEMULSIFICATION, CATARACT, WITH IOL INSERTION 3.60 00:32.0;  Surgeon: Mittie Gaskin, MD;  Location: [facility];  Service: Ophthalmology;  Laterality: Left;   CATARACT EXTRACTION W/PHACO Right 10/17/2023   Procedure: PHACOEMULSIFICATION, CATARACT, WITH IOL INSERTION 3.04 00:22.9;  Surgeon: Mittie Gaskin, MD;  Location: [facility];  Service: Ophthalmology;  Laterality: Right;   CHOLECYSTECTOMY  1999   INCISIONAL HERNIA REPAIR N/A 10/22/2013   Procedure: LAPAROSCOPIC INCISIONAL HERNIA;  Surgeon: Vicenta DELENA Poli, MD;  Location: [facility];  Service: General;  Laterality: N/A;   INSERTION OF MESH N/A 10/22/2013   Procedure: INSERTION OF MESH;  Surgeon: Vicenta DELENA Poli, MD;  Location: [facility];  Service: General;  Laterality: N/A;   NASAL SINUS SURGERY  1987   right knee arthroscopy Right 2005    Social History[2]   Medication list has been reviewed and updated.  Active Medications[3]     09/10/2023    1:43 PM 03/09/2023    9:10 AM 02/07/2023    3:23 PM 12/27/2022    2:06 PM  GAD 7 : Generalized Anxiety Score  Nervous, Anxious, on Edge 2 0 0 0  Control/stop worrying 1 0 0 0  Worry too much - different things 1 0 0 0  Trouble relaxing 0 0 0 0  Restless 1 0 0 0  Easily annoyed or irritable 2 0 0 0  Afraid - awful might happen 0 0 0 0   Total GAD 7 Score 7 0 0 0  Anxiety Difficulty Not difficult at all Not difficult at all Not difficult at all Not difficult at all       09/10/2023    1:43 PM 08/29/2023    3:41 PM 03/09/2023    9:10 AM  Depression screen PHQ 2/9  Decreased Interest 1 0 0  Down, Depressed, Hopeless 0 1 0  PHQ - 2 Score 1 1 0  Altered sleeping 2 0 0  Tired, decreased energy 2 0 0  Change in appetite 2 0 0  Feeling bad or failure about yourself  0 0 0  Trouble concentrating 0 0 0  Moving slowly or fidgety/restless 0 0 0  Suicidal thoughts 0 0 0  PHQ-9 Score 7  1  0   Difficult doing work/chores Not difficult at all Not difficult at all Not difficult at all     Data saved with a previous flowsheet row definition    BP Readings from Last 3 Encounters:  10/17/23 128/69  10/03/23 128/81  09/10/23 128/79  Physical Exam Vitals and nursing note reviewed.  Constitutional:      General: She is not in acute distress.    Appearance: She is well-developed.  HENT:     Head: Normocephalic and atraumatic.     Right Ear: Tympanic membrane and ear canal normal.     Left Ear: Tympanic membrane and ear canal normal.     Nose:     Right Sinus: No maxillary sinus tenderness.     Left Sinus: No maxillary sinus tenderness.  Eyes:     General: No scleral icterus.       Right eye: No discharge.        Left eye: No discharge.     Conjunctiva/sclera: Conjunctivae normal.  Neck:     Thyroid : No thyromegaly.     Vascular: No carotid bruit.  Cardiovascular:     Rate and Rhythm: Normal rate and regular rhythm.     Pulses: Normal pulses.     Heart sounds: Normal heart sounds.  Pulmonary:     Effort: Pulmonary effort is normal. No respiratory distress.     Breath sounds: No wheezing.  Abdominal:     General: Bowel sounds are normal.     Palpations: Abdomen is soft.     Tenderness: There is no abdominal tenderness.  Musculoskeletal:     Cervical back: Normal range of motion. No erythema.     Right lower  leg: No edema.     Left lower leg: No edema.  Lymphadenopathy:     Cervical: No cervical adenopathy.  Skin:    General: Skin is warm and dry.     Findings: No rash.  Neurological:     Mental Status: She is alert and oriented to person, place, and time.     Cranial Nerves: No cranial nerve deficit.     Sensory: No sensory deficit.     Deep Tendon Reflexes: Reflexes are normal and symmetric.  Psychiatric:        Attention and Perception: Attention normal.        Mood and Affect: Mood normal.     Wt Readings from Last 3 Encounters:  10/17/23 124 lb 12.8 oz (56.6 kg)  10/03/23 123 lb 7.3 oz (56 kg)  09/10/23 122 lb (55.3 kg)    There were no vitals taken for this visit.  Assessment and Plan:  Problem List Items Addressed This Visit       Unprioritized   History of left breast cancer (Chronic)   Essential hypertension (Chronic)   Mixed hyperlipidemia (Chronic)   Major depression single episode, in partial remission (Chronic)   GERD (gastroesophageal reflux disease)   Other Visit Diagnoses       Annual physical exam    -  Primary       No follow-ups on file.    Leita HILARIO Adie, MD  [facility] and [facility] Medicine [place]           [1] No Known Allergies [2]  Social History Tobacco Use   Smoking status: Former    Current packs/day: 0.00    Average packs/day: 1 pack/day for 39.0 years (39.0 ttl pk-yrs)    Types: Cigarettes    Start date: 36    Quit date: 2010    Years since quitting: 16.0    Passive exposure: Past   Smokeless tobacco: Never  Vaping Use   Vaping status: Never Used  Substance Use Topics   Alcohol use: Yes    Alcohol/week: 7.0 -  14.0 standard drinks of alcohol    Types: 7 - 14 Standard drinks or equivalent per week    Comment: 1-2 drinks every night   Drug use: No  [3]  No outpatient medications have been marked as taking for the 03/17/24 encounter (Appointment) with Justus Leita DEL, MD.   "

## 2024-03-18 MED ORDER — DONEPEZIL HCL 10 MG PO TABS: 10.0000 mg | ORAL_TABLET | Freq: Every day | ORAL | 0 refills | Status: DC

## 2024-03-18 NOTE — Telephone Encounter (Signed)
 Courtesy refill. Patient will need an office visit for additional refills.  Requested Prescriptions  Pending Prescriptions Disp Refills   donepezil  (ARICEPT ) 10 MG tablet 30 tablet 0    Sig: Take 1 tablet (10 mg total) by mouth at bedtime.     Neurology:  Alzheimer's Agents Failed - 03/18/2024  5:39 PM      Failed - Valid encounter within last 6 months    Recent Outpatient Visits           6 months ago Essential hypertension   [place] [facility] at [facility], Leita DEL, MD

## 2024-03-25 ENCOUNTER — Encounter: Payer: Self-pay | Admitting: Student

## 2024-03-25 ENCOUNTER — Ambulatory Visit (INDEPENDENT_AMBULATORY_CARE_PROVIDER_SITE_OTHER): Admitting: Student

## 2024-03-25 VITALS — BP 148/82 | HR 80 | Ht 60.98 in | Wt 132.0 lb

## 2024-03-25 DIAGNOSIS — G3184 Mild cognitive impairment, so stated: Secondary | ICD-10-CM

## 2024-03-25 DIAGNOSIS — N1831 Chronic kidney disease, stage 3a: Secondary | ICD-10-CM

## 2024-03-25 DIAGNOSIS — I10 Essential (primary) hypertension: Secondary | ICD-10-CM | POA: Diagnosis not present

## 2024-03-25 DIAGNOSIS — E782 Mixed hyperlipidemia: Secondary | ICD-10-CM

## 2024-03-25 DIAGNOSIS — Z0000 Encounter for general adult medical examination without abnormal findings: Secondary | ICD-10-CM

## 2024-03-25 DIAGNOSIS — Z853 Personal history of malignant neoplasm of breast: Secondary | ICD-10-CM

## 2024-03-25 MED ORDER — DONEPEZIL HCL 10 MG PO TABS: 10.0000 mg | ORAL_TABLET | Freq: Every day | ORAL | 1 refills | Status: AC

## 2024-03-25 NOTE — Assessment & Plan Note (Addendum)
 S/p lumpectomy in 2020 seeing oncology in [place]. Currently on anastrozole

## 2024-03-25 NOTE — Assessment & Plan Note (Addendum)
 Feels donepazil is helping with focus.  Refill this today.

## 2024-03-25 NOTE — Assessment & Plan Note (Addendum)
 Labs ordered today Flu vaccine up-to-date Mammogram and DXA ordered by oncology reports last done 03/2023, and has one ordered by oncology. Request records PAP: SP/ [facility] Colon cancer screening: reports she had a colonoscopy in 2023  and reports this was negative, request records

## 2024-03-25 NOTE — Assessment & Plan Note (Addendum)
 Does not check BP at home current medications include hydrochlorothiazide  12.5 mg daily, lisinopril  30 mg daily, and metoprolol  25 mg daily.  BP relatively elevated today, work on low-sodium and increasing exercise.  Will have her check home blood pressure and keep a log and follow-up in 1 month.

## 2024-03-25 NOTE — Progress Notes (Signed)
 "  Complete physical exam  Patient: Carolyn Lowe   DOB: 15-May-1950   74 y.o. Female  MRN: [id number]  Subjective:    Chief Complaint  Patient presents with   Annual Exam    Carolyn Lowe is a 74 y.o. female who presents today for a complete physical exam. She reports consuming a general diet. The patient does not participate in regular exercise at present. She generally feels well. She reports sleeping well. She does not have additional problems to discuss today.        Patient Care Team: Lemon Raisin, MD as PCP - General (Internal Medicine) Mittie Gaskin, MD as Referring Physician (Ophthalmology) Melene Fell, MD (Oncology)   Show/hide medication list[1]  ROS Refer to HPI     Objective:    BP (!) 148/82   Pulse 80   Ht 5' 0.98 (1.549 m)   Wt 132 lb (59.9 kg)   SpO2 94%   BMI 24.96 kg/m  BP Readings from Last 3 Encounters:  03/25/24 (!) 148/82  10/17/23 128/69  10/03/23 128/81    Physical Exam Constitutional:      Appearance: Normal appearance.  HENT:     Mouth/Throat:     Mouth: Mucous membranes are moist.     Pharynx: Oropharynx is clear.  Cardiovascular:     Rate and Rhythm: Normal rate and regular rhythm.  Pulmonary:     Effort: Pulmonary effort is normal.     Breath sounds: No rhonchi or rales.  Abdominal:     General: Abdomen is flat. Bowel sounds are normal. There is no distension.     Palpations: Abdomen is soft.     Tenderness: There is no abdominal tenderness.  Musculoskeletal:        General: Normal range of motion.     Right lower leg: No edema.     Left lower leg: No edema.  Skin:    General: Skin is warm and dry.     Capillary Refill: Capillary refill takes less than 2 seconds.  Neurological:     General: No focal deficit present.     Mental Status: She is alert and oriented to person, place, and time.  Psychiatric:        Mood and Affect: Mood normal.        Behavior: Behavior normal.         03/25/2024     4:45 PM 09/10/2023    1:43 PM 08/29/2023    3:41 PM  Depression screen PHQ 2/9  Decreased Interest 2 1 0  Down, Depressed, Hopeless 2 0 1  PHQ - 2 Score 4 1 1   Altered sleeping 2 2 0  Tired, decreased energy 2 2 0  Change in appetite 0 2 0  Feeling bad or failure about yourself  0 0 0  Trouble concentrating 2 0 0  Moving slowly or fidgety/restless 0 0 0  Suicidal thoughts 0 0 0  PHQ-9 Score 10 7  1    Difficult doing work/chores Somewhat difficult Not difficult at all Not difficult at all     Data saved with a previous flowsheet row definition      03/25/2024    4:45 PM 09/10/2023    1:43 PM 03/09/2023    9:10 AM 02/07/2023    3:23 PM  GAD 7 : Generalized Anxiety Score  Nervous, Anxious, on Edge 2 2 0 0  Control/stop worrying 2 1 0 0  Worry too much - different things 2 1 0  0  Trouble relaxing 2 0 0 0  Restless 2 1 0 0  Easily annoyed or irritable 2 2 0 0  Afraid - awful might happen 0 0 0 0  Total GAD 7 Score 12 7 0 0  Anxiety Difficulty Somewhat difficult Not difficult at all Not difficult at all Not difficult at all    Results for orders placed or performed in visit on 03/25/24  CBC with Differential/Platelet  Result Value Ref Range   WBC 5.8 3.4 - 10.8 x10E3/uL   RBC 4.00 3.77 - 5.28 x10E6/uL   Hemoglobin 13.2 11.1 - 15.9 g/dL   Hematocrit 59.5 65.9 - 46.6 %   MCV 101 (H) 79 - 97 fL   MCH 33.0 26.6 - 33.0 pg   MCHC 32.7 31.5 - 35.7 g/dL   RDW 87.7 88.2 - 84.5 %   Platelets 265 150 - 450 x10E3/uL   Neutrophils 60 Not Estab. %   Lymphs 25 Not Estab. %   Monocytes 11 Not Estab. %   Eos 3 Not Estab. %   Basos 1 Not Estab. %   Neutrophils Absolute 3.5 1.4 - 7.0 x10E3/uL   Lymphocytes Absolute 1.5 0.7 - 3.1 x10E3/uL   Monocytes Absolute 0.6 0.1 - 0.9 x10E3/uL   EOS (ABSOLUTE) 0.2 0.0 - 0.4 x10E3/uL   Basophils Absolute 0.1 0.0 - 0.2 x10E3/uL   Immature Granulocytes 0 Not Estab. %   Immature Grans (Abs) 0.0 0.0 - 0.1 x10E3/uL  Lipid panel  Result Value Ref Range    Cholesterol, Total 234 (H) 100 - 199 mg/dL   Triglycerides 738 (H) 0 - 149 mg/dL   HDL 69 >60 mg/dL   VLDL Cholesterol Cal 45 (H) 5 - 40 mg/dL   LDL Chol Calc (NIH) 879 (H) 0 - 99 mg/dL   Chol/HDL Ratio 3.4 0.0 - 4.4 ratio  Renal Function Panel  Result Value Ref Range   Glucose 127 (H) 70 - 99 mg/dL   BUN 17 8 - 27 mg/dL   Creatinine, Ser 8.85 (H) 0.57 - 1.00 mg/dL   eGFR 51 (L) >40 fO/fpw/8.26   BUN/Creatinine Ratio 15 12 - 28   Sodium 143 134 - 144 mmol/L   Potassium 4.6 3.5 - 5.2 mmol/L   Chloride 103 96 - 106 mmol/L   CO2 25 20 - 29 mmol/L   Calcium  9.8 8.7 - 10.3 mg/dL   Phosphorus 3.2 3.0 - 4.3 mg/dL   Albumin 4.7 3.8 - 4.8 g/dL  TSH  Result Value Ref Range   TSH 3.500 0.450 - 4.500 uIU/mL       Assessment & Plan:    Routine Health Maintenance and Physical Exam  Health Maintenance  Topic Date Due   Zoster (Shingles) Vaccine (1 of 2) Never done   Colon Cancer Screening  Never done   Osteoporosis screening with Bone Density Scan  11/26/2023   Breast Cancer Screening  12/08/2023   COVID-19 Vaccine (4 - 2025-26 season) 11/17/2024*   Medicare Annual Wellness Visit  08/28/2024   DTaP/Tdap/Td vaccine (2 - Td or Tdap) 04/11/2028   Pneumococcal Vaccine for age over 86  Completed   Flu Shot  Completed   Hepatitis C Screening  Completed   Meningitis B Vaccine  Aged Out  *Topic was postponed. The date shown is not the original due date.    Discussed health benefits of physical activity, and encouraged her to engage in regular exercise appropriate for her age and condition.  Annual physical exam Assessment &  Plan: Labs ordered today Flu vaccine up-to-date Mammogram and DXA ordered by oncology reports last done 03/2023, and has one ordered by oncology. Request records PAP: SP/ [facility] Colon cancer screening: reports she had a colonoscopy in 2023  and reports this was negative, request records  Orders: -     CBC with Differential/Platelet -     Lipid panel -     Renal  function panel -     TSH  History of right breast cancer Assessment & Plan: S/p lumpectomy in 2020 seeing oncology in [place]. Currently on anastrozole    MCI (mild cognitive impairment) Assessment & Plan: Feels donepazil is helping with focus.  Refill this today.  Orders: -     Donepezil  HCl; Take 1 tablet (10 mg total) by mouth at bedtime.  Dispense: 90 tablet; Refill: 1  Essential hypertension Assessment & Plan: Does not check BP at home current medications include hydrochlorothiazide  12.5 mg daily, lisinopril  30 mg daily, and metoprolol  25 mg daily.  BP relatively elevated today, work on low-sodium and increasing exercise.  Will have her check home blood pressure and keep a log and follow-up in 1 month.  Orders: -     CBC with Differential/Platelet  Mixed hyperlipidemia -     Lipid panel  CKD stage 3a, GFR 45-59 ml/min (HCC) Assessment & Plan: Will check renal function today,GFR has been around 46, encourage hydration and avoiding NSAIDs.  She is off Mobic currently.  Orders: -     Renal function panel    Return for HTN.     Harlene Saddler, MD     [1]  Outpatient Medications Prior to Visit  Medication Sig   anastrozole (ARIMIDEX) 1 MG tablet Take 1 mg by mouth daily.   aspirin  EC 81 MG tablet Take 81 mg by mouth daily.   Azelastine  HCl 137 MCG/SPRAY SOLN Place 2 sprays into both nostrils 2 (two) times daily.   buPROPion  (WELLBUTRIN  XL) 150 MG 24 hr tablet TAKE 1 TABLET BY MOUTH EVERY DAY   hydrochlorothiazide  (HYDRODIURIL ) 12.5 MG tablet TAKE 1 TABLET BY MOUTH EVERY DAY   ipratropium (ATROVENT) 0.06 % nasal spray Place 2 sprays into both nostrils 2 (two) times daily.   lisinopril  (ZESTRIL ) 30 MG tablet TAKE 1 TABLET BY MOUTH EVERY DAY   metoprolol  succinate (TOPROL -XL) 25 MG 24 hr tablet TAKE 1 TABLET (25 MG TOTAL) BY MOUTH DAILY.   mirtazapine  (REMERON ) 15 MG tablet TAKE 1 TABLET BY MOUTH EVERYDAY AT BEDTIME   Misc Natural Products (TART CHERRY  ADVANCED PO) Take by mouth.   Multiple Vitamin (MULTIVITAMIN WITH MINERALS) TABS tablet Take 1 tablet by mouth daily.   nitroGLYCERIN (NITROSTAT) 0.4 MG SL tablet Place 0.4 mg under the tongue every 5 (five) minutes as needed.   pantoprazole  (PROTONIX ) 40 MG tablet TAKE 1 TABLET BY MOUTH EVERYDAY AT BEDTIME   simvastatin  (ZOCOR ) 40 MG tablet TAKE 1 TABLET BY MOUTH EVERYDAY AT BEDTIME   vitamin E 1000 UNIT capsule Take 1,000 Units by mouth daily.   [DISCONTINUED] donepezil  (ARICEPT ) 10 MG tablet Take 1 tablet (10 mg total) by mouth at bedtime.   [DISCONTINUED] meloxicam (MOBIC) 15 MG tablet Take 15 mg by mouth daily.   No facility-administered medications prior to visit.   "

## 2024-03-26 ENCOUNTER — Ambulatory Visit: Payer: Self-pay | Admitting: Student

## 2024-03-26 LAB — CBC WITH DIFFERENTIAL/PLATELET
Basophils Absolute: 0.1 x10E3/uL (ref 0.0–0.2)
Basos: 1 %
EOS (ABSOLUTE): 0.2 x10E3/uL (ref 0.0–0.4)
Eos: 3 %
Hematocrit: 40.4 % (ref 34.0–46.6)
Hemoglobin: 13.2 g/dL (ref 11.1–15.9)
Immature Grans (Abs): 0 x10E3/uL (ref 0.0–0.1)
Immature Granulocytes: 0 %
Lymphocytes Absolute: 1.5 x10E3/uL (ref 0.7–3.1)
Lymphs: 25 %
MCH: 33 pg (ref 26.6–33.0)
MCHC: 32.7 g/dL (ref 31.5–35.7)
MCV: 101 fL — ABNORMAL HIGH (ref 79–97)
Monocytes Absolute: 0.6 x10E3/uL (ref 0.1–0.9)
Monocytes: 11 %
Neutrophils Absolute: 3.5 x10E3/uL (ref 1.4–7.0)
Neutrophils: 60 %
Platelets: 265 x10E3/uL (ref 150–450)
RBC: 4 x10E6/uL (ref 3.77–5.28)
RDW: 12.2 % (ref 11.7–15.4)
WBC: 5.8 x10E3/uL (ref 3.4–10.8)

## 2024-03-26 LAB — RENAL FUNCTION PANEL
Albumin: 4.7 g/dL (ref 3.8–4.8)
BUN/Creatinine Ratio: 15 (ref 12–28)
BUN: 17 mg/dL (ref 8–27)
CO2: 25 mmol/L (ref 20–29)
Calcium: 9.8 mg/dL (ref 8.7–10.3)
Chloride: 103 mmol/L (ref 96–106)
Creatinine, Ser: 1.14 mg/dL — ABNORMAL HIGH (ref 0.57–1.00)
Glucose: 127 mg/dL — ABNORMAL HIGH (ref 70–99)
Phosphorus: 3.2 mg/dL (ref 3.0–4.3)
Potassium: 4.6 mmol/L (ref 3.5–5.2)
Sodium: 143 mmol/L (ref 134–144)
eGFR: 51 mL/min/1.73 — ABNORMAL LOW

## 2024-03-26 LAB — LIPID PANEL
Chol/HDL Ratio: 3.4 ratio (ref 0.0–4.4)
Cholesterol, Total: 234 mg/dL — ABNORMAL HIGH (ref 100–199)
HDL: 69 mg/dL
LDL Chol Calc (NIH): 120 mg/dL — ABNORMAL HIGH (ref 0–99)
Triglycerides: 261 mg/dL — ABNORMAL HIGH (ref 0–149)
VLDL Cholesterol Cal: 45 mg/dL — ABNORMAL HIGH (ref 5–40)

## 2024-03-26 LAB — TSH: TSH: 3.5 u[IU]/mL (ref 0.450–4.500)

## 2024-03-28 ENCOUNTER — Ambulatory Visit: Payer: Self-pay | Admitting: Student

## 2024-03-31 NOTE — Assessment & Plan Note (Signed)
 Will check renal function today,GFR has been around 46, encourage hydration and avoiding NSAIDs.  She is off Mobic currently.

## 2024-04-03 MED ORDER — ATORVASTATIN CALCIUM 40 MG PO TABS: 40.0000 mg | ORAL_TABLET | Freq: Every day | ORAL | 1 refills | Status: AC

## 2024-04-03 NOTE — Addendum Note (Signed)
 Addended by: CAMACHO OCAMPO, Kaitlin Alcindor M on: 04/03/2024 03:03 PM   Modules accepted: Orders

## 2024-04-28 ENCOUNTER — Ambulatory Visit: Admitting: Student

## 2024-09-03 ENCOUNTER — Ambulatory Visit
